# Patient Record
Sex: Female | Born: 1980 | Race: Black or African American | Hispanic: No | Marital: Single | Smoking: Never smoker
Health system: Southern US, Community
[De-identification: ages and names within clinical notes are randomized; demographics above are authoritative.]

## PROBLEM LIST (undated history)

## (undated) DIAGNOSIS — E669 Obesity, unspecified: Secondary | ICD-10-CM

## (undated) DIAGNOSIS — D219 Benign neoplasm of connective and other soft tissue, unspecified: Secondary | ICD-10-CM

## (undated) DIAGNOSIS — E282 Polycystic ovarian syndrome: Secondary | ICD-10-CM

## (undated) DIAGNOSIS — F419 Anxiety disorder, unspecified: Secondary | ICD-10-CM

---

## 1999-01-29 ENCOUNTER — Emergency Department (HOSPITAL_COMMUNITY): Admission: EM | Admit: 1999-01-29 | Discharge: 1999-01-29 | Payer: Self-pay | Admitting: Emergency Medicine

## 1999-01-29 ENCOUNTER — Encounter: Payer: Self-pay | Admitting: Emergency Medicine

## 2000-10-16 ENCOUNTER — Other Ambulatory Visit: Admission: RE | Admit: 2000-10-16 | Discharge: 2000-10-16 | Payer: Self-pay | Admitting: Obstetrics & Gynecology

## 2000-10-16 ENCOUNTER — Encounter (INDEPENDENT_AMBULATORY_CARE_PROVIDER_SITE_OTHER): Payer: Self-pay

## 2002-07-03 ENCOUNTER — Emergency Department (HOSPITAL_COMMUNITY): Admission: EM | Admit: 2002-07-03 | Discharge: 2002-07-04 | Payer: Self-pay | Admitting: Emergency Medicine

## 2002-07-04 ENCOUNTER — Encounter: Payer: Self-pay | Admitting: Emergency Medicine

## 2002-08-11 ENCOUNTER — Other Ambulatory Visit: Admission: RE | Admit: 2002-08-11 | Discharge: 2002-08-11 | Payer: Self-pay | Admitting: Obstetrics & Gynecology

## 2003-12-30 ENCOUNTER — Other Ambulatory Visit: Admission: RE | Admit: 2003-12-30 | Discharge: 2003-12-30 | Payer: Self-pay | Admitting: Obstetrics & Gynecology

## 2004-06-02 ENCOUNTER — Encounter: Admission: RE | Admit: 2004-06-02 | Discharge: 2004-06-02 | Payer: Self-pay | Admitting: Obstetrics & Gynecology

## 2006-06-29 ENCOUNTER — Emergency Department (HOSPITAL_COMMUNITY): Admission: EM | Admit: 2006-06-29 | Discharge: 2006-06-29 | Payer: Self-pay | Admitting: Emergency Medicine

## 2006-11-03 ENCOUNTER — Emergency Department (HOSPITAL_COMMUNITY): Admission: EM | Admit: 2006-11-03 | Discharge: 2006-11-03 | Payer: Self-pay | Admitting: Emergency Medicine

## 2007-12-31 ENCOUNTER — Emergency Department (HOSPITAL_COMMUNITY): Admission: EM | Admit: 2007-12-31 | Discharge: 2007-12-31 | Payer: Self-pay | Admitting: Emergency Medicine

## 2009-04-25 ENCOUNTER — Emergency Department (HOSPITAL_COMMUNITY): Admission: EM | Admit: 2009-04-25 | Discharge: 2009-04-25 | Payer: Self-pay | Admitting: Emergency Medicine

## 2009-06-05 ENCOUNTER — Emergency Department (HOSPITAL_COMMUNITY): Admission: EM | Admit: 2009-06-05 | Discharge: 2009-06-05 | Payer: Self-pay | Admitting: Emergency Medicine

## 2010-06-11 ENCOUNTER — Inpatient Hospital Stay (HOSPITAL_COMMUNITY): Admission: AD | Admit: 2010-06-11 | Discharge: 2010-06-11 | Payer: Self-pay | Admitting: Family Medicine

## 2010-06-11 ENCOUNTER — Ambulatory Visit: Payer: Self-pay | Admitting: Obstetrics and Gynecology

## 2010-07-10 ENCOUNTER — Emergency Department (HOSPITAL_COMMUNITY): Admission: EM | Admit: 2010-07-10 | Discharge: 2010-07-10 | Payer: Self-pay | Admitting: Emergency Medicine

## 2011-01-24 ENCOUNTER — Emergency Department (HOSPITAL_BASED_OUTPATIENT_CLINIC_OR_DEPARTMENT_OTHER)
Admission: EM | Admit: 2011-01-24 | Discharge: 2011-01-24 | Disposition: A | Discharge status: Home / Self Care | Payer: BC Managed Care – PPO | Attending: Emergency Medicine | Admitting: Emergency Medicine

## 2011-01-24 DIAGNOSIS — R05 Cough: Secondary | ICD-10-CM | POA: Insufficient documentation

## 2011-01-24 DIAGNOSIS — R059 Cough, unspecified: Secondary | ICD-10-CM | POA: Insufficient documentation

## 2011-01-24 DIAGNOSIS — J40 Bronchitis, not specified as acute or chronic: Secondary | ICD-10-CM | POA: Insufficient documentation

## 2011-02-25 LAB — URINE MICROSCOPIC-ADD ON

## 2011-02-25 LAB — URINALYSIS, ROUTINE W REFLEX MICROSCOPIC: Urobilinogen, UA: 0.2 mg/dL (ref 0.0–1.0)

## 2011-02-25 LAB — CBC
HCT: 36.8 % (ref 36.0–46.0)
MCH: 28.2 pg (ref 26.0–34.0)
MCV: 84.2 fL (ref 78.0–100.0)
RBC: 4.38 MIL/uL (ref 3.87–5.11)
WBC: 12.1 10*3/uL — ABNORMAL HIGH (ref 4.0–10.5)

## 2011-02-25 LAB — POCT PREGNANCY, URINE: Preg Test, Ur: NEGATIVE

## 2011-02-25 LAB — WET PREP, GENITAL: Clue Cells Wet Prep HPF POC: NONE SEEN

## 2011-03-20 LAB — URINALYSIS, ROUTINE W REFLEX MICROSCOPIC
Bilirubin Urine: NEGATIVE
Glucose, UA: NEGATIVE mg/dL
Hgb urine dipstick: NEGATIVE
Nitrite: NEGATIVE
Urobilinogen, UA: 0.2 mg/dL (ref 0.0–1.0)

## 2011-03-20 LAB — URINE MICROSCOPIC-ADD ON

## 2011-03-20 LAB — WET PREP, GENITAL
Clue Cells Wet Prep HPF POC: NONE SEEN
Trich, Wet Prep: NONE SEEN

## 2011-03-20 LAB — GC/CHLAMYDIA PROBE AMP, GENITAL: Chlamydia, DNA Probe: NEGATIVE

## 2012-07-29 ENCOUNTER — Other Ambulatory Visit: Payer: Self-pay | Admitting: Obstetrics & Gynecology

## 2012-07-29 DIAGNOSIS — D259 Leiomyoma of uterus, unspecified: Secondary | ICD-10-CM

## 2012-07-30 ENCOUNTER — Ambulatory Visit (HOSPITAL_COMMUNITY)
Admission: RE | Admit: 2012-07-30 | Discharge: 2012-07-30 | Disposition: A | Discharge status: Home / Self Care | Payer: BC Managed Care – PPO | Source: Ambulatory Visit | Attending: Obstetrics & Gynecology | Admitting: Obstetrics & Gynecology

## 2012-07-30 DIAGNOSIS — D259 Leiomyoma of uterus, unspecified: Secondary | ICD-10-CM

## 2012-08-04 ENCOUNTER — Other Ambulatory Visit: Payer: Self-pay | Admitting: Obstetrics & Gynecology

## 2012-08-05 ENCOUNTER — Ambulatory Visit
Admission: RE | Admit: 2012-08-05 | Discharge: 2012-08-05 | Disposition: A | Discharge status: Home / Self Care | Payer: BC Managed Care – PPO | Source: Ambulatory Visit | Attending: Obstetrics & Gynecology | Admitting: Obstetrics & Gynecology

## 2012-08-05 ENCOUNTER — Other Ambulatory Visit: Payer: Self-pay | Admitting: Obstetrics & Gynecology

## 2012-08-05 ENCOUNTER — Ambulatory Visit: Admission: RE | Admit: 2012-08-05 | Payer: BC Managed Care – PPO | Source: Ambulatory Visit

## 2012-08-05 DIAGNOSIS — D259 Leiomyoma of uterus, unspecified: Secondary | ICD-10-CM

## 2012-08-05 MED ORDER — GADOBENATE DIMEGLUMINE 529 MG/ML IV SOLN
20.0000 mL | Freq: Once | INTRAVENOUS | Status: AC | PRN
  Administered 2012-08-05: 20 mL via INTRAVENOUS

## 2012-09-01 ENCOUNTER — Encounter (HOSPITAL_COMMUNITY): Payer: Self-pay | Admitting: Pharmacy Technician

## 2012-09-08 NOTE — Patient Instructions (Signed)
20 Desa L Florance  09/08/2012   Your procedure is scheduled on:  09/12/12 [id number]  Report to [facility] at 0515 AM.  Call this number if you have problems the morning of surgery: [phone]   Remember:   Do not eat food:After Midnight.  May have clear liquids:until Midnight .    Take these medicines the morning of surgery with A SIP OF WATER:    Do not wear jewelry, make-up or nail polish.  Do not wear lotions, powders, or perfumes.   Do not shave 48 hours prior to surgery.   Do not bring valuables to the hospital.  Contacts, dentures or bridgework may not be worn into surgery.  Leave suitcase in the car. After surgery it may be brought to your room.  For patients admitted to the hospital, checkout time is 11:00 AM the day of discharge.   Special Instructions: SEE CHG INSTRUCTION SHEET    Please read over the following fact sheets that you were given: MRSA Information, coughing and deep breathing exercises, leg exercises

## 2012-09-09 ENCOUNTER — Encounter (HOSPITAL_COMMUNITY)
Admission: RE | Admit: 2012-09-09 | Discharge: 2012-09-09 | Disposition: A | Discharge status: Home / Self Care | Payer: BC Managed Care – PPO | Source: Ambulatory Visit | Attending: Obstetrics & Gynecology | Admitting: Obstetrics & Gynecology

## 2012-09-09 ENCOUNTER — Encounter (HOSPITAL_COMMUNITY): Payer: Self-pay

## 2012-09-09 ENCOUNTER — Ambulatory Visit (HOSPITAL_COMMUNITY)
Admission: RE | Admit: 2012-09-09 | Discharge: 2012-09-09 | Disposition: A | Discharge status: Home / Self Care | Payer: BC Managed Care – PPO | Source: Ambulatory Visit | Attending: Obstetrics & Gynecology | Admitting: Obstetrics & Gynecology

## 2012-09-09 DIAGNOSIS — Z01812 Encounter for preprocedural laboratory examination: Secondary | ICD-10-CM | POA: Insufficient documentation

## 2012-09-09 DIAGNOSIS — Z01818 Encounter for other preprocedural examination: Secondary | ICD-10-CM | POA: Insufficient documentation

## 2012-09-09 DIAGNOSIS — Z01810 Encounter for preprocedural cardiovascular examination: Secondary | ICD-10-CM | POA: Insufficient documentation

## 2012-09-09 DIAGNOSIS — R0602 Shortness of breath: Secondary | ICD-10-CM | POA: Insufficient documentation

## 2012-09-09 LAB — SURGICAL PCR SCREEN
MRSA, PCR: NEGATIVE
Staphylococcus aureus: NEGATIVE

## 2012-09-09 LAB — CBC
HCT: 38.8 % (ref 36.0–46.0)
MCV: 80.7 fL (ref 78.0–100.0)
Platelets: 315 10*3/uL (ref 150–400)
RBC: 4.81 MIL/uL (ref 3.87–5.11)
WBC: 6.6 10*3/uL (ref 4.0–10.5)

## 2012-09-09 LAB — BASIC METABOLIC PANEL
CO2: 21 mEq/L (ref 19–32)
Chloride: 104 mEq/L (ref 96–112)
Creatinine, Ser: 0.68 mg/dL (ref 0.50–1.10)

## 2012-09-09 LAB — HCG, SERUM, QUALITATIVE: Preg, Serum: NEGATIVE

## 2012-09-10 ENCOUNTER — Encounter (HOSPITAL_COMMUNITY): Payer: Self-pay | Admitting: Obstetrics & Gynecology

## 2012-09-10 DIAGNOSIS — D259 Leiomyoma of uterus, unspecified: Secondary | ICD-10-CM | POA: Diagnosis present

## 2012-09-10 NOTE — H&P (Signed)
  Subjective:  Courtney Molina is a 31 y.o.  female.  She has a history of AUB. Bleeding is characterized as heavy. Length of bleeding: prolonged.  Evaluation to date: lab: CBC with diff, TSH and prolactin, testosterone, pelvic ultrasound: positive for a dominant uterine myoma. and a MRI was performed for mapping purposes.The MRI demonstrated an intramural, type 2, posterior myoma without endometrial involvement. Treatment to date: provera  Pertinent Gyn History: PCOS Menses see above Bleeding: see above  Last pap: normal Date: 4/12  Patient Active Problem List   Diagnosis Date Noted  . Leiomyoma of uterus, unspecified 09/10/2012   History reviewed. No pertinent past medical history.  History reviewed. No pertinent past surgical history.  No prescriptions prior to admission   Allergies  Allergen Reactions  . Pineapple Other (See Comments)    Allergy from when she was younger, cause her to break out, not sure if it still an allergy today    History  Substance Use Topics  . Smoking status: Never Smoker   . Smokeless tobacco: Never Used  . Alcohol Use: Yes     socially     History reviewed. No pertinent family history.   Review of Systems Pertinent items are noted in HPI.    Objective:   Vital signs in last 24 hours:    General:   alert  Skin:   normal  HEENT:  PERRLA  Lungs:   clear to auscultation bilaterally  Heart:   regular rate and rhythm, S1, S2 normal, no murmur, click, rub or gallop  Breasts:   normal without suspicious masses, skin or nipple changes or axillary nodes  Abdomen:  soft, non-tender; bowel sounds normal; no masses,  no organomegaly  Pelvis:  External genitalia: normal general appearance Vaginal: normal mucosa without prolapse or lesions Cervix: normal appearance Adnexa: normal bimanual exam Uterus: anteverted and enlarged                                     Assessment/Plan:  Symptomatic fibroid uterus.  AUB-L, O    I had a  lengthy discussion with the patient regarding her bleeding and consideration for a robotic-assisted myomectomy. Procedure, risks, reasons, benefits and complications (including injury to bowel, bladder, major blood vessel, ureter, bleeding, possibility of transfusion, infection, or fistula formation), possible cesarean delivery with a subsequent pregnancy were reviewed in detail. Consent was signed and preop testing was ordered.  Instructions were reviewed, including NPO after midnight.

## 2012-09-12 ENCOUNTER — Ambulatory Visit (HOSPITAL_COMMUNITY): Payer: BC Managed Care – PPO | Admitting: Anesthesiology

## 2012-09-12 ENCOUNTER — Encounter (HOSPITAL_COMMUNITY): Payer: Self-pay | Admitting: *Deleted

## 2012-09-12 ENCOUNTER — Encounter (HOSPITAL_COMMUNITY)
Admission: RE | Disposition: A | Discharge status: Home / Self Care | Payer: Self-pay | Source: Ambulatory Visit | Attending: Obstetrics & Gynecology

## 2012-09-12 ENCOUNTER — Ambulatory Visit (HOSPITAL_COMMUNITY)
Admission: RE | Admit: 2012-09-12 | Discharge: 2012-09-12 | Disposition: A | Discharge status: Home / Self Care | Payer: BC Managed Care – PPO | Source: Ambulatory Visit | Attending: Obstetrics & Gynecology | Admitting: Obstetrics & Gynecology

## 2012-09-12 ENCOUNTER — Encounter (HOSPITAL_COMMUNITY): Payer: Self-pay | Admitting: Anesthesiology

## 2012-09-12 DIAGNOSIS — D259 Leiomyoma of uterus, unspecified: Secondary | ICD-10-CM

## 2012-09-12 DIAGNOSIS — N926 Irregular menstruation, unspecified: Secondary | ICD-10-CM | POA: Insufficient documentation

## 2012-09-12 DIAGNOSIS — N939 Abnormal uterine and vaginal bleeding, unspecified: Secondary | ICD-10-CM | POA: Insufficient documentation

## 2012-09-12 HISTORY — PX: ROBOT ASSISTED MYOMECTOMY: SHX5142

## 2012-09-12 HISTORY — DX: Benign neoplasm of connective and other soft tissue, unspecified: D21.9

## 2012-09-12 HISTORY — DX: Obesity, unspecified: E66.9

## 2012-09-12 LAB — GLUCOSE, CAPILLARY: Glucose-Capillary: 140 mg/dL — ABNORMAL HIGH (ref 70–99)

## 2012-09-12 LAB — TYPE AND SCREEN
ABO/RH(D): B NEG
Antibody Screen: NEGATIVE

## 2012-09-12 SURGERY — ROBOTIC ASSISTED MYOMECTOMY
Anesthesia: General | Wound class: Clean Contaminated

## 2012-09-12 MED ORDER — VASOPRESSIN 20 UNIT/ML IJ SOLN
INTRAMUSCULAR | Status: DC | PRN
  Administered 2012-09-12: 20 [IU]

## 2012-09-12 MED ORDER — ACETAMINOPHEN 10 MG/ML IV SOLN
INTRAVENOUS | Status: DC | PRN
  Administered 2012-09-12: 1000 mg via INTRAVENOUS

## 2012-09-12 MED ORDER — ONDANSETRON HCL 4 MG/2ML IJ SOLN
4.0000 mg | Freq: Four times a day (QID) | INTRAMUSCULAR | Status: DC | PRN
  Administered 2012-09-12: 4 mg via INTRAVENOUS
  Filled 2012-09-12: qty 2

## 2012-09-12 MED ORDER — METOCLOPRAMIDE HCL 5 MG/ML IJ SOLN
INTRAMUSCULAR | Status: DC | PRN
  Administered 2012-09-12: 10 mg via INTRAVENOUS

## 2012-09-12 MED ORDER — VASOPRESSIN 20 UNIT/ML IJ SOLN
Freq: Once | INTRAVENOUS | Status: DC
  Filled 2012-09-12: qty 1

## 2012-09-12 MED ORDER — HETASTARCH-ELECTROLYTES 6 % IV SOLN: INTRAVENOUS | Status: DC | PRN

## 2012-09-12 MED ORDER — PROMETHAZINE HCL 25 MG/ML IJ SOLN: 6.2500 mg | INTRAMUSCULAR | Status: DC | PRN

## 2012-09-12 MED ORDER — LABETALOL HCL 5 MG/ML IV SOLN
INTRAVENOUS | Status: AC
  Filled 2012-09-12: qty 4

## 2012-09-12 MED ORDER — SODIUM CHLORIDE 0.9 % IJ SOLN: 3.0000 mL | INTRAMUSCULAR | Status: DC | PRN

## 2012-09-12 MED ORDER — SODIUM CHLORIDE 0.9 % IJ SOLN: 3.0000 mL | Freq: Two times a day (BID) | INTRAMUSCULAR | Status: DC

## 2012-09-12 MED ORDER — ACETAMINOPHEN 650 MG RE SUPP
650.0000 mg | RECTAL | Status: DC | PRN
  Filled 2012-09-12: qty 1

## 2012-09-12 MED ORDER — HETASTARCH-ELECTROLYTES 6 % IV SOLN
INTRAVENOUS | Status: DC | PRN
  Administered 2012-09-12: 08:00:00 via INTRAVENOUS

## 2012-09-12 MED ORDER — BUPIVACAINE HCL (PF) 0.5 % IJ SOLN
INTRAMUSCULAR | Status: DC | PRN
  Administered 2012-09-12: 10 mL

## 2012-09-12 MED ORDER — ACETAMINOPHEN 10 MG/ML IV SOLN
INTRAVENOUS | Status: AC
  Filled 2012-09-12: qty 100

## 2012-09-12 MED ORDER — DEXTROSE 5 % IV SOLN
3.0000 g | INTRAVENOUS | Status: AC
  Administered 2012-09-12: 3 g via INTRAVENOUS
  Filled 2012-09-12: qty 3000

## 2012-09-12 MED ORDER — ROCURONIUM BROMIDE 100 MG/10ML IV SOLN
INTRAVENOUS | Status: DC | PRN
  Administered 2012-09-12: 60 mg via INTRAVENOUS
  Administered 2012-09-12: 30 mg via INTRAVENOUS
  Administered 2012-09-12 (×2): 10 mg via INTRAVENOUS

## 2012-09-12 MED ORDER — LABETALOL HCL 5 MG/ML IV SOLN
INTRAVENOUS | Status: DC | PRN
  Administered 2012-09-12 (×2): 10 mg via INTRAVENOUS

## 2012-09-12 MED ORDER — FENTANYL CITRATE 0.05 MG/ML IJ SOLN
25.0000 ug | INTRAMUSCULAR | Status: DC | PRN
  Administered 2012-09-12 (×3): 25 ug via INTRAVENOUS

## 2012-09-12 MED ORDER — FENTANYL CITRATE 0.05 MG/ML IJ SOLN
INTRAMUSCULAR | Status: AC
  Filled 2012-09-12: qty 2

## 2012-09-12 MED ORDER — CEFAZOLIN SODIUM 1-5 GM-% IV SOLN
INTRAVENOUS | Status: AC
  Filled 2012-09-12: qty 50

## 2012-09-12 MED ORDER — CEFAZOLIN SODIUM-DEXTROSE 2-3 GM-% IV SOLR
INTRAVENOUS | Status: AC
  Filled 2012-09-12: qty 50

## 2012-09-12 MED ORDER — MIDAZOLAM HCL 5 MG/5ML IJ SOLN
INTRAMUSCULAR | Status: DC | PRN
  Administered 2012-09-12: 2 mg via INTRAVENOUS
  Administered 2012-09-12: .5 mg via INTRAVENOUS

## 2012-09-12 MED ORDER — SUFENTANIL CITRATE 50 MCG/ML IV SOLN
INTRAVENOUS | Status: DC | PRN
  Administered 2012-09-12 (×2): 10 ug via INTRAVENOUS
  Administered 2012-09-12 (×3): 5 ug via INTRAVENOUS
  Administered 2012-09-12: 10 ug via INTRAVENOUS
  Administered 2012-09-12: 5 ug via INTRAVENOUS
  Administered 2012-09-12: 15 ug via INTRAVENOUS
  Administered 2012-09-12 (×2): 5 ug via INTRAVENOUS
  Administered 2012-09-12: 10 ug via INTRAVENOUS
  Administered 2012-09-12 (×3): 5 ug via INTRAVENOUS
  Administered 2012-09-12: 15 ug via INTRAVENOUS

## 2012-09-12 MED ORDER — HYDROMORPHONE HCL PF 1 MG/ML IJ SOLN: 0.2500 mg | INTRAMUSCULAR | Status: DC | PRN

## 2012-09-12 MED ORDER — OXYCODONE-ACETAMINOPHEN 5-325 MG PO TABS: 1.0000 | ORAL_TABLET | ORAL | Status: DC | PRN

## 2012-09-12 MED ORDER — ACETAMINOPHEN 325 MG PO TABS: 650.0000 mg | ORAL_TABLET | ORAL | Status: DC | PRN

## 2012-09-12 MED ORDER — ONDANSETRON HCL 4 MG/2ML IJ SOLN
INTRAMUSCULAR | Status: DC | PRN
  Administered 2012-09-12: 4 mg via INTRAVENOUS

## 2012-09-12 MED ORDER — LACTATED RINGERS IV SOLN
INTRAVENOUS | Status: DC | PRN
  Administered 2012-09-12 (×2): via INTRAVENOUS

## 2012-09-12 MED ORDER — BUPIVACAINE HCL (PF) 0.5 % IJ SOLN
INTRAMUSCULAR | Status: AC
  Filled 2012-09-12: qty 30

## 2012-09-12 MED ORDER — SODIUM CHLORIDE 0.9 % IV SOLN: 250.0000 mL | INTRAVENOUS | Status: DC | PRN

## 2012-09-12 MED ORDER — PROPOFOL 10 MG/ML IV BOLUS
INTRAVENOUS | Status: DC | PRN
  Administered 2012-09-12: 80 mg via INTRAVENOUS
  Administered 2012-09-12: 60 mg via INTRAVENOUS
  Administered 2012-09-12: 300 mg via INTRAVENOUS

## 2012-09-12 MED ORDER — LIDOCAINE HCL 4 % MT SOLN
OROMUCOSAL | Status: DC | PRN
  Administered 2012-09-12: 4 mL via TOPICAL

## 2012-09-12 MED ORDER — NEOSTIGMINE METHYLSULFATE 1 MG/ML IJ SOLN
INTRAMUSCULAR | Status: DC | PRN
  Administered 2012-09-12: 5 mg via INTRAVENOUS

## 2012-09-12 MED ORDER — HYDROMORPHONE HCL PF 1 MG/ML IJ SOLN
INTRAMUSCULAR | Status: DC | PRN
  Administered 2012-09-12 (×3): 1 mg via INTRAVENOUS

## 2012-09-12 MED ORDER — GLYCOPYRROLATE 0.2 MG/ML IJ SOLN
INTRAMUSCULAR | Status: DC | PRN
  Administered 2012-09-12: .8 mg via INTRAVENOUS

## 2012-09-12 MED ORDER — IBUPROFEN 800 MG PO TABS: 800.0000 mg | ORAL_TABLET | Freq: Three times a day (TID) | ORAL | Status: DC | PRN

## 2012-09-12 MED ORDER — OXYCODONE HCL 5 MG PO TABS
5.0000 mg | ORAL_TABLET | ORAL | Status: DC | PRN
  Administered 2012-09-12: 5 mg via ORAL
  Filled 2012-09-12: qty 1

## 2012-09-12 MED ORDER — SUCCINYLCHOLINE CHLORIDE 20 MG/ML IJ SOLN
INTRAMUSCULAR | Status: DC | PRN
  Administered 2012-09-12: 40 mg via INTRAVENOUS
  Administered 2012-09-12: 140 mg via INTRAVENOUS

## 2012-09-12 MED ORDER — LIDOCAINE HCL (CARDIAC) 20 MG/ML IV SOLN
INTRAVENOUS | Status: DC | PRN
  Administered 2012-09-12: 30 mg via INTRAVENOUS

## 2012-09-12 SURGICAL SUPPLY — 64 items
APPLICATOR SURGIFLO ENDO (HEMOSTASIS) ×2 IMPLANT
BARRIER ADHS 3X4 INTERCEED (GAUZE/BANDAGES/DRESSINGS) ×4 IMPLANT
BLADE LAPAROSCOPIC MORCELL KIT (BLADE) ×4 IMPLANT
CLOTH BEACON ORANGE TIMEOUT ST (SAFETY) ×2 IMPLANT
CORDS BIPOLAR (ELECTRODE) ×2 IMPLANT
COVER MAYO STAND STRL (DRAPES) ×2 IMPLANT
COVER SURGICAL LIGHT HANDLE (MISCELLANEOUS) ×2 IMPLANT
COVER TIP SHEARS 8 DVNC (MISCELLANEOUS) ×1 IMPLANT
COVER TIP SHEARS 8MM DA VINCI (MISCELLANEOUS) ×1
DECANTER SPIKE VIAL GLASS SM (MISCELLANEOUS) ×2 IMPLANT
DERMABOND ADVANCED (GAUZE/BANDAGES/DRESSINGS) ×2
DERMABOND ADVANCED .7 DNX12 (GAUZE/BANDAGES/DRESSINGS) ×2 IMPLANT
DRAPE LG THREE QUARTER DISP (DRAPES) ×4 IMPLANT
DRAPE SURG IRRIG POUCH 19X23 (DRAPES) ×2 IMPLANT
DRAPE TABLE BACK 44X90 PK DISP (DRAPES) ×4 IMPLANT
DRAPE UTILITY XL STRL (DRAPES) ×2 IMPLANT
DRAPE WARM FLUID 44X44 (DRAPE) ×2 IMPLANT
DRSG TEGADERM 2-3/8X2-3/4 SM (GAUZE/BANDAGES/DRESSINGS) ×2 IMPLANT
DRSG TEGADERM 6X8 (GAUZE/BANDAGES/DRESSINGS) ×2 IMPLANT
ELECT REM PT RETURN 9FT ADLT (ELECTROSURGICAL) ×2
ELECTRODE REM PT RTRN 9FT ADLT (ELECTROSURGICAL) ×1 IMPLANT
FILTER SMOKE EVAC LAPAROSHD (FILTER) ×2 IMPLANT
GLOVE BIO SURGEON STRL SZ 6.5 (GLOVE) ×2 IMPLANT
GLOVE SS BIOGEL STRL SZ 8 (GLOVE) ×2 IMPLANT
GLOVE SUPERSENSE BIOGEL SZ 8 (GLOVE) ×2
GOWN STRL NON-REIN LRG LVL3 (GOWN DISPOSABLE) ×8 IMPLANT
IV LACTATED RINGER IRRG 3000ML (IV SOLUTION) ×1
IV LR IRRIG 3000ML ARTHROMATIC (IV SOLUTION) ×1 IMPLANT
KIT ACCESSORY DA VINCI DISP (KITS) ×1
KIT ACCESSORY DVNC DISP (KITS) ×1 IMPLANT
LUBRICANT JELLY K Y 4OZ (MISCELLANEOUS) ×2 IMPLANT
MANIPULATOR UTERINE 4.5 ZUMI (MISCELLANEOUS) ×2 IMPLANT
NEEDLE SPNL 22GX9.0 ACCUTG (NEEDLE) ×2 IMPLANT
OCCLUDER COLPOPNEUMO (BALLOONS) IMPLANT
PACK LAPAROSCOPY W LONG (CUSTOM PROCEDURE TRAY) ×2 IMPLANT
PENCIL BUTTON HOLSTER BLD 10FT (ELECTRODE) ×2 IMPLANT
POSITIONER SURGICAL ARM (MISCELLANEOUS) ×4 IMPLANT
POUCH SPECIMEN RETRIEVAL 10MM (ENDOMECHANICALS) ×2 IMPLANT
SEPRAFILM MEMBRANE 5X6 (MISCELLANEOUS) ×2 IMPLANT
SET TUBE IRRIG SUCTION NO TIP (IRRIGATION / IRRIGATOR) ×2 IMPLANT
SHEET LAVH (DRAPES) ×2 IMPLANT
SOLUTION ELECTROLUBE (MISCELLANEOUS) ×2 IMPLANT
SUT MNCRL AB 4-0 PS2 18 (SUTURE) ×4 IMPLANT
SUT V-LOC BARB 180 2/0GR6 GS22 (SUTURE) ×2
SUT VIC AB 0 CT1 27 (SUTURE)
SUT VIC AB 0 CT1 27XBRD ANTBC (SUTURE) IMPLANT
SUT VIC AB 0 CT2 27 (SUTURE) ×8 IMPLANT
SUT VIC AB 2-0 CT2 27 (SUTURE) IMPLANT
SUT VICRYL 0 UR6 27IN ABS (SUTURE) ×4 IMPLANT
SUT VLOC BARB 180 ABS3/0GR12 (SUTURE) ×4
SUTURE V-LC BRB 180 2/0GR6GS22 (SUTURE) ×1 IMPLANT
SUTURE VLOC BRB 180 ABS3/0GR12 (SUTURE) ×2 IMPLANT
SYR 20CC LL (SYRINGE) ×2 IMPLANT
SYR 50ML LL SCALE MARK (SYRINGE) ×2 IMPLANT
SYR BULB IRRIGATION 50ML (SYRINGE) ×2 IMPLANT
TOWEL OR 17X26 10 PK STRL BLUE (TOWEL DISPOSABLE) ×2 IMPLANT
TRAY FOLEY CATH 14FRSI W/METER (CATHETERS) ×2 IMPLANT
TROCAR 12M 150ML BLUNT (TROCAR) ×2 IMPLANT
TROCAR BLADELESS OPT 5 150 (ENDOMECHANICALS) ×2 IMPLANT
TROCAR BLADELESS OPT 5 75 (ENDOMECHANICALS) ×2 IMPLANT
TROCAR ENDOPATH XCEL 12X100 BL (ENDOMECHANICALS) ×2 IMPLANT
TROCAR XCEL 12X100 BLDLESS (ENDOMECHANICALS) ×2 IMPLANT
TUBING FILTER THERMOFLATOR (ELECTROSURGICAL) ×2 IMPLANT
WATER STERILE IRR 1500ML POUR (IV SOLUTION) ×4 IMPLANT

## 2012-09-12 NOTE — Op Note (Signed)
Pre-operative Diagnosis: abnormal uterine bleeding and fibroids  Post-operative Diagnosis: same  Operation: Robotic-assisted myomectomy  Surgeon: Roseanna Rainbow  Assistant: Coral Ceo, MD  Anesthesia: GET  Urine Output: per Anesthesiology  Findings: Dominant, posterior, intramural myoma, approximately 6 cm in diameter  Estimated Blood Loss:  less than 100 mL                 Total IV Fluids: per Anesthesiology         Specimens: PATHOLOGY               Complications:  None; patient tolerated the procedure well.         Disposition: PACU - hemodynamically stable.         Condition: Stable    Procedure Details  The patient was seen in the Holding Room. The risks, benefits, complications, treatment options, and expected outcomes were discussed with the patient.  The patient concurred with the proposed plan, giving informed consent.  The site of surgery properly noted/marked. The patient was identified as Courtney Molina and the procedure verified as a Robotic-assisted myomectomy.  A Time Out was held and the above information confirmed.  After induction of anesthesia, the patient was draped and prepped in the usual sterile manner. Pt was placed in supine position after anesthesia and draped and prepped in the usual sterile manner. The abdominal drape was placed after the CholoraPrep had been allowed to dry for 3 minutes.  Her arms were tucked to her side with all appropriate precautions.  The shoulder blocks were placed in the usual fashion.  The patient was placed in the semi-lithotomy position in [place] stirrups.  The perineum was prepped with Betadine.  Foley catheter was placed.  A sterile speculum was placed in the vagina.  The cervix was grasped with a single-tooth tenaculum and dilated with Shawnie Pons dilators.  The ZUMI uterine manipulator  was placed without difficulty.  .  A second time-out was performed.  OG tube placement was confirmed and to suction.   Approximately 2 cm below the costal margin, in the midclavicular line the skin was anesthestized with 0.25% Marcaine.  A 5 mm incision was made and using a 5 mm Optiview, a 5 mm trocar was placed under direct vision.  The patient's abdomen was insufflated with CO2 gas.  At this point and all points during the procedure, the patient's intra-abdominal pressure did not exceed 15 mmHg.  A 10-12 camera port was place 24 cm above the pubic symphysis.  Bilateral 8 mm ports were place 10 cm and 15 degrees inferior.  An 8 mm port placed in the right upper quadrant.   All ports were placed under direct visualization.  The 5 mm port was removed.  The incision was extended to accommodate a 10 mm trocar.  A 10 mm trocar and sleeve were advanced under direct visualization.   The robot was docked in the usual fashion.  The myometrium overlying the myoma was infiltrated with a dilute Pitressin solution.  Using monopolar scissors, the serosa and myometrium overlying the myoma was incised in a transverse fashion.  The incision was extended to capsule.  The myoma was grasped with a tenaculum.  Using sharp and blunt dissection, the myoma was enucleated.  The defect in the myometrium was closed in 3 layers.  This was accomplished using a running 2-0 Vlock suture for the initial two layers.  The serosa was closed with a running 3-0 Vlock suture.  Seprafilm was drizzled over  the incision.   The robot was undocked.  The left upper quadrant port was replaced with a morcellator.  The myoma was morcellated.  Visualized fragments were removed from the abdomen.  The ports were removed.  Deep, subcutaneous, figure-of-eight 0-Vicryl sutures on a UR-6 needle were placed in the 10-12 mm supraumbilical and infracostal incisions.  All skin incisions were closed in a subcuticular fashion using 3-0 Monocryl.  Dermabond was then applied.  The ZUMI manipulator was removed.  All instrument and needle counts were correct x  2.

## 2012-09-12 NOTE — Progress Notes (Signed)
Pt arrived in PACU II with 18 gauge angiocath left hand with LR infusing via gravity. Removed with catheter intact and gauze dressing applied with tape. Removed per protocol for pt discharge. Pt able to ambulate to bathroom and tolerated well.

## 2012-09-12 NOTE — Anesthesia Procedure Notes (Signed)
Procedure Name: Intubation Performed by: Anastasio Champion E Pre-anesthesia Checklist: Patient identified, Emergency Drugs available, Suction available and Patient being monitored Patient Re-evaluated:Patient Re-evaluated prior to inductionOxygen Delivery Method: Circle system utilized Preoxygenation: Pre-oxygenation with 100% oxygen Intubation Type: IV induction Ventilation: Mask ventilation without difficulty Tube type: Oral Tube size: 7.5 mm Number of attempts: 1 Airway Equipment and Method: Rigid stylet Placement Confirmation: ETT inserted through vocal cords under direct vision,  positive ETCO2 and breath sounds checked- equal and bilateral Secured at: 21 cm Tube secured with: Tape Dental Injury: Teeth and Oropharynx as per pre-operative assessment  Difficulty Due To: Difficulty was anticipated, Difficult Airway- due to reduced neck mobility, Difficult Airway- due to large tongue and Difficult Airway- due to limited oral opening Future Recommendations: Recommend- induction with short-acting agent, and alternative techniques readily available Comments: Pt preoxygenated for 3 minutes. Very short neck with limited opening, large tongue,larynx slightly anterior.  MAC 4 used with rigid stylet for glidescope used tand glide ETT used to facilitate angle of larynx.  +ETCO2, BBS by Dr. Payton Doughty.No pillow under head for intubation but placed gel donuts after

## 2012-09-12 NOTE — Anesthesia Preprocedure Evaluation (Addendum)
Anesthesia Evaluation  Patient identified by MRN, date of birth, ID band Patient awake    Reviewed: Allergy & Precautions, H&P , NPO status , Patient's Chart, lab work & pertinent test results  Airway Mallampati: III TM Distance: >3 FB Neck ROM: Full    Dental No notable dental hx.    Pulmonary neg pulmonary ROS,  breath sounds clear to auscultation  Pulmonary exam normal       Cardiovascular negative cardio ROS  Rhythm:Regular Rate:Normal     Neuro/Psych negative neurological ROS  negative psych ROS   GI/Hepatic negative GI ROS, Neg liver ROS,   Endo/Other  diabetes, Type 2, Oral Hypoglycemic AgentsMorbid obesity  Renal/GU negative Renal ROS  negative genitourinary   Musculoskeletal negative musculoskeletal ROS (+)   Abdominal (+) + obese,   Peds negative pediatric ROS (+)  Hematology negative hematology ROS (+)   Anesthesia Other Findings   Reproductive/Obstetrics negative OB ROS                          Anesthesia Physical Anesthesia Plan  ASA: III  Anesthesia Plan: General   Post-op Pain Management:    Induction: Intravenous  Airway Management Planned: Oral ETT  Additional Equipment:   Intra-op Plan:   Post-operative Plan: Extubation in OR  Informed Consent: I have reviewed the patients History and Physical, chart, labs and discussed the procedure including the risks, benefits and alternatives for the proposed anesthesia with the patient or authorized representative who has indicated his/her understanding and acceptance.   Dental advisory given  Plan Discussed with: CRNA  Anesthesia Plan Comments: (Discussed with patient the possibility that because of her weight, robotic surgery may not by possible (ventilation issues). Patient voices understanding.)       Anesthesia Quick Evaluation

## 2012-09-12 NOTE — Transfer of Care (Signed)
Immediate Anesthesia Transfer of Care Note  Patient: Courtney Molina  Procedure(s) Performed: Procedure(s) (LRB) with comments: ROBOTIC ASSISTED MYOMECTOMY (N/A)  Patient Location: PACU  Anesthesia Type: General  Level of Consciousness: awake, alert , oriented and patient cooperative  Airway & Oxygen Therapy: Patient Spontanous Breathing and Patient connected to face mask oxygen  Post-op Assessment: Report given to PACU RN, Post -op Vital signs reviewed and stable and Patient moving all extremities X 4  Post vital signs: stable  Complications: No apparent anesthesia complications

## 2012-09-12 NOTE — Anesthesia Postprocedure Evaluation (Signed)
  Anesthesia Post-op Note  Patient: Courtney Molina  Procedure(s) Performed: Procedure(s) (LRB): ROBOTIC ASSISTED MYOMECTOMY (N/A)  Patient Location: PACU  Anesthesia Type: General  Level of Consciousness: awake and alert   Airway and Oxygen Therapy: Patient Spontanous Breathing  Post-op Pain: mild  Post-op Assessment: Post-op Vital signs reviewed, Patient's Cardiovascular Status Stable, Respiratory Function Stable, Patent Airway and No signs of Nausea or vomiting  Post-op Vital Signs: stable  Complications: No apparent anesthesia complications. Will keep her longer in Short Stay Unit.

## 2012-09-12 NOTE — Interval H&P Note (Signed)
History and Physical Interval Note:  09/12/2012 7:02 AM  Courtney Molina  has presented today for surgery, with the diagnosis of uterine fibroids  The various methods of treatment have been discussed with the patient and family. After consideration of risks, benefits and other options for treatment, the patient has consented to  Procedure(s) (LRB) with comments: ROBOTIC ASSISTED MYOMECTOMY (N/A) as a surgical intervention .  The patient's history has been reviewed, patient examined, no change in status, stable for surgery.  I have reviewed the patient's chart and labs.  Questions were answered to the patient's satisfaction.     JACKSON-MOORE,Tyreona Panjwani A

## 2012-09-15 ENCOUNTER — Encounter (HOSPITAL_COMMUNITY): Payer: Self-pay | Admitting: Obstetrics & Gynecology

## 2012-09-16 ENCOUNTER — Emergency Department (HOSPITAL_COMMUNITY)
Admission: EM | Admit: 2012-09-16 | Discharge: 2012-09-16 | Disposition: A | Discharge status: Home / Self Care | Payer: BC Managed Care – PPO | Attending: Emergency Medicine | Admitting: Emergency Medicine

## 2012-09-16 ENCOUNTER — Encounter (HOSPITAL_COMMUNITY): Payer: Self-pay | Admitting: Emergency Medicine

## 2012-09-16 DIAGNOSIS — T819XXA Unspecified complication of procedure, initial encounter: Secondary | ICD-10-CM | POA: Insufficient documentation

## 2012-09-16 DIAGNOSIS — E669 Obesity, unspecified: Secondary | ICD-10-CM | POA: Insufficient documentation

## 2012-09-16 DIAGNOSIS — Z91018 Allergy to other foods: Secondary | ICD-10-CM | POA: Insufficient documentation

## 2012-09-16 DIAGNOSIS — Y849 Medical procedure, unspecified as the cause of abnormal reaction of the patient, or of later complication, without mention of misadventure at the time of the procedure: Secondary | ICD-10-CM | POA: Insufficient documentation

## 2012-09-16 DIAGNOSIS — E119 Type 2 diabetes mellitus without complications: Secondary | ICD-10-CM | POA: Insufficient documentation

## 2012-09-16 NOTE — ED Provider Notes (Signed)
History     CSN: [id number]  Arrival date & time 09/16/12  1452   First MD Initiated Contact with Patient 09/16/12 1652      Chief Complaint  Patient presents with  . Wound Check    (Consider location/radiation/quality/duration/timing/severity/associated sxs/prior treatment) HPI Comments: Patient presents 4 days after myomectomy. Patient had 5 abdominal surgical wounds closed with tissue adhesive. Today patient has noted that the center incision was slightly opened. She has not noticed significant drainage. She has not had worsening pain, drainage, surrounding redness or erythema. No fever. Nothing makes symptoms better or worse. Onset acute. Course constant. Patient states she called her gynecologist however office has not returned her call.  Patient is a 31 y.o. female presenting with wound check. The history is provided by the patient.  Wound Check     Past Medical History  Diagnosis Date  . Diabetes mellitus   . Fibroids   . Obesity     Past Surgical History  Procedure Date  . Robot assisted myomectomy 09/12/2012    Procedure: ROBOTIC ASSISTED MYOMECTOMY;  Surgeon: Antionette Char, MD;  Location: [facility];  Service: Gynecology;  Laterality: N/A;    History reviewed. No pertinent family history.  History  Substance Use Topics  . Smoking status: Never Smoker   . Smokeless tobacco: Never Used  . Alcohol Use: Yes     socially     OB History    Grav Para Term Preterm Abortions TAB SAB Ect Mult Living                  Review of Systems  Constitutional: Negative for fever.  Gastrointestinal: Negative for nausea and vomiting.  Skin: Positive for wound.    Allergies  Pineapple  Home Medications   Current Outpatient Rx  Name Route Sig Dispense Refill  . METFORMIN HCL 850 MG PO TABS Oral Take 850 mg by mouth daily at 12 noon.    . OXYCODONE-ACETAMINOPHEN 5-325 MG PO TABS Oral Take 1 tablet by mouth every 4 (four) hours as needed for pain. 30 tablet 0  .  IBUPROFEN 800 MG PO TABS Oral Take 1 tablet (800 mg total) by mouth every 8 (eight) hours as needed for pain. 30 tablet 5    BP 114/77  Pulse 88  Temp 98.7 F (37.1 C) (Oral)  SpO2 98%  LMP 08/26/2012  Physical Exam  Nursing note and vitals reviewed. Constitutional: She appears well-developed and well-nourished.  HENT:  Head: Normocephalic and atraumatic.  Eyes: Conjunctivae normal are normal.  Neck: Normal range of motion. Neck supple.  Pulmonary/Chest: No respiratory distress.  Abdominal:       Five healing abdominal incisions. Centermost incision has approximately 2-3 mm area which glue was worn away and is slightly opened. No drainage. No surrounding redness or erythema. Remaining wounds intact.  Neurological: She is alert.  Skin: Skin is warm and dry.  Psychiatric: She has a normal mood and affect.    ED Course  Procedures (including critical care time)  Labs Reviewed - No data to display No results found.   1. Postoperative complication     5:27 PM Patient seen and examined. Dermabond applied to lower aspect of surgical wound to reinforce. Patient urged to f/u with GYN prn.  Vital signs reviewed and are as follows: Filed Vitals:   09/16/12 1515  BP: 114/77  Pulse: 88  Temp: 98.7 F (37.1 C)   The patient was urged to return to the Emergency Department urgently with  worsening pain, swelling, expanding erythema especially if it streaks away from the affected area, fever, or if they have any other concerns. Patient verbalized understanding.   MDM  Post-op complication. No evidence of infection. Routine follow-up indicated. Did not close opening, however reinforced bottom of wound to prevent further dehiscence.       Renne Crigler, [place] 09/16/12 1734

## 2012-09-16 NOTE — ED Notes (Signed)
Pt states she had a myomectomy Friday (4 days ago) and one of her incisions has now lost some glue. Incision is only slightly exposed where glue has come off. No signs of infection present. Denies fever. Has been taking showers but not baths, as she was directed.

## 2012-09-16 NOTE — ED Provider Notes (Signed)
Medical screening examination/treatment/procedure(s) were performed by non-physician practitioner and as supervising physician I was immediately available for consultation/collaboration.   Zoa Dowty Y. Jinnie Onley, MD 09/16/12 2311 

## 2014-03-28 ENCOUNTER — Inpatient Hospital Stay (HOSPITAL_COMMUNITY)
Admission: AD | Admit: 2014-03-28 | Discharge: 2014-03-28 | Disposition: A | Discharge status: Home / Self Care | Payer: Managed Care, Other (non HMO) | Source: Ambulatory Visit | Attending: Obstetrics & Gynecology | Admitting: Obstetrics & Gynecology

## 2014-03-28 ENCOUNTER — Encounter (HOSPITAL_COMMUNITY): Payer: Self-pay | Admitting: *Deleted

## 2014-03-28 DIAGNOSIS — N949 Unspecified condition associated with female genital organs and menstrual cycle: Secondary | ICD-10-CM | POA: Insufficient documentation

## 2014-03-28 DIAGNOSIS — N939 Abnormal uterine and vaginal bleeding, unspecified: Secondary | ICD-10-CM

## 2014-03-28 DIAGNOSIS — D259 Leiomyoma of uterus, unspecified: Secondary | ICD-10-CM | POA: Insufficient documentation

## 2014-03-28 DIAGNOSIS — N925 Other specified irregular menstruation: Secondary | ICD-10-CM | POA: Insufficient documentation

## 2014-03-28 DIAGNOSIS — N926 Irregular menstruation, unspecified: Secondary | ICD-10-CM | POA: Insufficient documentation

## 2014-03-28 DIAGNOSIS — N938 Other specified abnormal uterine and vaginal bleeding: Secondary | ICD-10-CM | POA: Insufficient documentation

## 2014-03-28 DIAGNOSIS — E282 Polycystic ovarian syndrome: Secondary | ICD-10-CM | POA: Insufficient documentation

## 2014-03-28 DIAGNOSIS — N390 Urinary tract infection, site not specified: Secondary | ICD-10-CM | POA: Insufficient documentation

## 2014-03-28 DIAGNOSIS — E119 Type 2 diabetes mellitus without complications: Secondary | ICD-10-CM | POA: Insufficient documentation

## 2014-03-28 HISTORY — DX: Polycystic ovarian syndrome: E28.2

## 2014-03-28 LAB — CBC
HEMATOCRIT: 38.1 % (ref 36.0–46.0)
Hemoglobin: 12.8 g/dL (ref 12.0–15.0)
MCH: 27.8 pg (ref 26.0–34.0)
MCHC: 33.6 g/dL (ref 30.0–36.0)
MCV: 82.6 fL (ref 78.0–100.0)
PLATELETS: 298 10*3/uL (ref 150–400)
RBC: 4.61 MIL/uL (ref 3.87–5.11)
RDW: 13.2 % (ref 11.5–15.5)
WBC: 9.3 10*3/uL (ref 4.0–10.5)

## 2014-03-28 LAB — URINE MICROSCOPIC-ADD ON

## 2014-03-28 LAB — URINALYSIS, ROUTINE W REFLEX MICROSCOPIC
Bilirubin Urine: NEGATIVE
GLUCOSE, UA: NEGATIVE mg/dL
KETONES UR: NEGATIVE mg/dL
Nitrite: POSITIVE — AB
PROTEIN: 100 mg/dL — AB
Specific Gravity, Urine: 1.02 (ref 1.005–1.030)
UROBILINOGEN UA: 1 mg/dL (ref 0.0–1.0)
pH: 6.5 (ref 5.0–8.0)

## 2014-03-28 LAB — POCT PREGNANCY, URINE: Preg Test, Ur: NEGATIVE

## 2014-03-28 MED ORDER — SULFAMETHOXAZOLE-TRIMETHOPRIM 800-160 MG PO TABS: 1.0000 | ORAL_TABLET | Freq: Two times a day (BID) | ORAL | Status: AC

## 2014-03-28 MED ORDER — MEDROXYPROGESTERONE ACETATE 10 MG PO TABS: 10.0000 mg | ORAL_TABLET | Freq: Every day | ORAL | Status: DC

## 2014-03-28 NOTE — MAU Note (Signed)
Pt presents to MAU for complaints of heavy vaginal bleeding for 2 weeks. States a history of PCOS.

## 2014-03-28 NOTE — Discharge Instructions (Signed)
Abnormal Uterine Bleeding Abnormal uterine bleeding can affect women at various stages in life, including teenagers, women in their reproductive years, pregnant women, and women who have reached menopause. Several kinds of uterine bleeding are considered abnormal, including:  Bleeding or spotting between periods.   Bleeding after sexual intercourse.   Bleeding that is heavier or more than normal.   Periods that last longer than usual.  Bleeding after menopause.  Many cases of abnormal uterine bleeding are minor and simple to treat, while others are more serious. Any type of abnormal bleeding should be evaluated by your health care provider. Treatment will depend on the cause of the bleeding. HOME CARE INSTRUCTIONS Monitor your condition for any changes. The following actions may help to alleviate any discomfort you are experiencing:  Avoid the use of tampons and douches as directed by your health care provider.  Change your pads frequently. You should get regular pelvic exams and Pap tests. Keep all follow-up appointments for diagnostic tests as directed by your health care provider.  SEEK MEDICAL CARE IF:   Your bleeding lasts more than 1 week.   You feel dizzy at times.  SEEK IMMEDIATE MEDICAL CARE IF:   You pass out.   You are changing pads every 15 to 30 minutes.   You have abdominal pain.  You have a fever.   You become sweaty or weak.   You are passing large blood clots from the vagina.   You start to feel nauseous and vomit. MAKE SURE YOU:   Understand these instructions.  Will watch your condition.  Will get help right away if you are not doing well or get worse. Document Released: 11/26/2005 Document Revised: 07/29/2013 Document Reviewed: 06/25/2013 [facility] Patient Information 2014 [place], [place].  Endometrial Biopsy Endometrial biopsy is a procedure in which a tissue sample is taken from inside the uterus. The tissue sample is then looked at  under a microscope to see if the tissue is normal or abnormal. The endometrium is the lining of the uterus. This procedure helps determine where you are in your menstrual cycle and how hormone levels are affecting the lining of the uterus. This procedure may also be used to evaluate uterine bleeding or to diagnose endometrial cancer, tuberculosis, polyps, or inflammatory conditions.  LET [facility] CARE PROVIDER KNOW ABOUT:  Any allergies you have.  All medicines you are taking, including vitamins, herbs, eye drops, creams, and over-the-counter medicines.  Previous problems you or members of your family have had with the use of anesthetics.  Any blood disorders you have.  Previous surgeries you have had.  Medical conditions you have.  Possibility of pregnancy. RISKS AND COMPLICATIONS Generally, this is a safe procedure. However, as with any procedure, complications can occur. Possible complications include:  Bleeding.  Pelvic infection.  Puncture of the uterine wall with the biopsy device (rare). BEFORE THE PROCEDURE   Keep a record of your menstrual cycles as directed by your health care provider. You may need to schedule your procedure for a specific time in your cycle.  You may want to bring a sanitary pad to wear home after the procedure.  Arrange for someone to drive you home after the procedure if you will be given a medicine to help you relax (sedative). PROCEDURE   You may be given a sedative to relax you.  You will lie on an exam table with your feet and legs supported as in a pelvic exam.  Your health care provider will insert an  instrument (speculum) into your vagina to see your cervix.  Your cervix will be cleansed with an antiseptic solution. A medicine (local anesthetic) will be used to numb the cervix.  A forceps instrument (tenaculum) will be used to hold your cervix steady for the biopsy.  A thin, rodlike instrument (uterine sound) will be inserted  through your cervix to determine the length of your uterus and the location where the biopsy sample will be removed.  A thin, flexible tube (catheter) will be inserted through your cervix and into the uterus. The catheter is used to collect the biopsy sample from your endometrial tissue.  The catheter and speculum will then be removed, and the tissue sample will be sent to a lab for examination. AFTER THE PROCEDURE  You will rest in a recovery area until you are ready to go home.  You may have mild cramping and a small amount of vaginal bleeding for a few days after the procedure. This is normal.  Make sure you find out how to get your test results. Document Released: 03/29/2005 Document Revised: 07/29/2013 Document Reviewed: 05/13/2013 [facility] Patient Information 2014 [place], [place].

## 2014-03-28 NOTE — MAU Provider Note (Signed)
History     CSN: [id number]  Arrival date and time: 03/28/14 1354   First Provider Initiated Contact with Patient 03/28/14 1535      Chief Complaint  Patient presents with  . Vaginal Bleeding   HPI [name] is a 33 y.o. G0P0 who presents to MAU today with complaint of vaginal bleeding x 2 weeks. She has a history of irregular periods secondary to PCOS. She also had myomectomy in 2013. She states that she recently took herself off of Metformin because she didn't go back to PCP for refills. The patient is also a type 2 DM. She states heavy bleeding worse in the mornings with clots today. She occasionally feels weak, but denies dizziness or LOC. She denies pain, fever, UTI symptoms or flank pain. She has not been sexually active in over 1 year.   OB History   Grav Para Term Preterm Abortions TAB SAB Ect Mult Living   0               Past Medical History  Diagnosis Date  . Diabetes mellitus   . Fibroids   . Obesity   . PCOS (polycystic ovarian syndrome)     Past Surgical History  Procedure Laterality Date  . Robot assisted myomectomy  09/12/2012    Procedure: ROBOTIC ASSISTED MYOMECTOMY;  Surgeon: Lahoma Crocker, MD;  Location: [facility];  Service: Gynecology;  Laterality: N/A;    History reviewed. No pertinent family history.  History  Substance Use Topics  . Smoking status: Never Smoker   . Smokeless tobacco: Never Used  . Alcohol Use: Yes     Comment: socially     Allergies:  Allergies  Allergen Reactions  . Pineapple Other (See Comments)    Allergy from when she was younger, cause her to break out, not sure if it still an allergy today    No prescriptions prior to admission    Review of Systems  Constitutional: Negative for fever and malaise/fatigue.  Gastrointestinal: Negative for nausea, vomiting, abdominal pain, diarrhea and constipation.  Genitourinary: Negative for dysuria, urgency and frequency.       + vaginal bleeding  Neurological:  Positive for weakness. Negative for dizziness and loss of consciousness.   Physical Exam   Blood pressure 108/74, pulse 92, temperature 98 F (36.7 C), temperature source Oral, resp. rate 16, height 5\' 1"  (1.549 m), weight 134.718 kg (297 lb), last menstrual period 03/12/2014.  Physical Exam  Constitutional: She is oriented to person, place, and time. She appears well-developed and well-nourished. No distress.  HENT:  Head: Normocephalic and atraumatic.  Cardiovascular: Normal rate.   Respiratory: Effort normal.  GI: Soft. She exhibits no distension and no mass. There is no tenderness. There is no rebound and no guarding.  Genitourinary: Uterus is not enlarged and not tender. Cervix exhibits no motion tenderness, no discharge and no friability. Right adnexum displays no mass and no tenderness. Left adnexum displays no mass. There is bleeding (scant blood in the vaginal vault) around the vagina. No vaginal discharge found.  Neurological: She is alert and oriented to person, place, and time.  Skin: Skin is warm and dry. No erythema.  Psychiatric: She has a normal mood and affect.   Results for orders placed during the hospital encounter of 03/28/14 (from the past 24 hour(s))  CBC     Status: None   Collection Time    03/28/14  2:05 PM      Result Value Ref Range  WBC 9.3  4.0 - 10.5 K/uL   RBC 4.61  3.87 - 5.11 MIL/uL   Hemoglobin 12.8  12.0 - 15.0 g/dL   HCT 38.1  36.0 - 46.0 %   MCV 82.6  78.0 - 100.0 fL   MCH 27.8  26.0 - 34.0 pg   MCHC 33.6  30.0 - 36.0 g/dL   RDW 13.2  11.5 - 15.5 %   Platelets 298  150 - 400 K/uL  POCT PREGNANCY, URINE     Status: None   Collection Time    03/28/14  2:22 PM      Result Value Ref Range   Preg Test, Ur NEGATIVE  NEGATIVE  URINALYSIS, ROUTINE W REFLEX MICROSCOPIC     Status: Abnormal   Collection Time    03/28/14  2:40 PM      Result Value Ref Range   Color, Urine RED (*) YELLOW   APPearance HAZY (*) CLEAR   Specific Gravity, Urine 1.020   1.005 - 1.030   pH 6.5  5.0 - 8.0   Glucose, UA NEGATIVE  NEGATIVE mg/dL   Hgb urine dipstick LARGE (*) NEGATIVE   Bilirubin Urine NEGATIVE  NEGATIVE   Ketones, ur NEGATIVE  NEGATIVE mg/dL   Protein, ur 100 (*) NEGATIVE mg/dL   Urobilinogen, UA 1.0  0.0 - 1.0 mg/dL   Nitrite POSITIVE (*) NEGATIVE   Leukocytes, UA TRACE (*) NEGATIVE  URINE MICROSCOPIC-ADD ON     Status: Abnormal   Collection Time    03/28/14  2:40 PM      Result Value Ref Range   WBC, UA 0-2  <3 WBC/hpf   RBC / HPF TOO NUMEROUS TO COUNT  <3 RBC/hpf   Bacteria, UA FEW (*) RARE    MAU Course  Procedures None  MDM UPT - negative UA, CBC today  Assessment and Plan  A: UTI AUB  P: Discharge home Rx for Provera and Cipro given to patient Bleeding precautions discussed Patient advised to incerease PO hydration Discussed warning signs for pyelonephritis Patient encourage to scheduled follow-p with Dr. Delsa Sale as soon as possible  Patient may return to MAU as needed or if her condition were to change or worsen  Farris Has, PA-C  03/28/2014, 5:13 PM

## 2014-04-20 ENCOUNTER — Encounter: Payer: Self-pay | Admitting: Obstetrics & Gynecology

## 2014-04-20 ENCOUNTER — Ambulatory Visit (INDEPENDENT_AMBULATORY_CARE_PROVIDER_SITE_OTHER): Payer: Managed Care, Other (non HMO) | Admitting: Obstetrics & Gynecology

## 2014-04-20 VITALS — BP 98/61 | HR 79 | Ht 62.0 in | Wt 296.0 lb

## 2014-04-20 DIAGNOSIS — N946 Dysmenorrhea, unspecified: Secondary | ICD-10-CM | POA: Insufficient documentation

## 2014-04-20 DIAGNOSIS — N926 Irregular menstruation, unspecified: Secondary | ICD-10-CM

## 2014-04-20 DIAGNOSIS — D259 Leiomyoma of uterus, unspecified: Secondary | ICD-10-CM

## 2014-04-20 DIAGNOSIS — N939 Abnormal uterine and vaginal bleeding, unspecified: Secondary | ICD-10-CM

## 2014-04-20 DIAGNOSIS — E282 Polycystic ovarian syndrome: Secondary | ICD-10-CM | POA: Insufficient documentation

## 2014-04-20 MED ORDER — MEGESTROL ACETATE 40 MG PO TABS: ORAL_TABLET | ORAL | Status: DC

## 2014-04-20 MED ORDER — NAPROXEN SODIUM ER 500 MG PO TB24: 500.0000 mg | ORAL_TABLET | Freq: Every day | ORAL | Status: DC

## 2014-04-20 MED ORDER — METFORMIN HCL ER 750 MG PO TB24: 750.0000 mg | ORAL_TABLET | Freq: Two times a day (BID) | ORAL | Status: DC

## 2014-04-20 NOTE — Progress Notes (Signed)
   CLINIC ENCOUNTER NOTE  History:  33 y.o. G0P0 here today for evaluation of irregular bleeding and dysmenorrhea. Has history of PCOS, has long history of AUB. Also has a history of robotic myomectomy in 2013.  She feels her dysmenorrhea has gradually been getting worse and her AUB has been also worsening. She wonders if her fibroids have recurred.  The following portions of the patient's history were reviewed and updated as appropriate: allergies, current medications, past family history, past medical history, past social history, past surgical history and problem list. Cannot remember when her last pap was.  Review of Systems:  Pertinent items are noted in HPI.  Objective:  Physical Exam BP 98/61  Pulse 79  Ht 5\' 2"  (1.575 m)  Wt 296 lb (134.265 kg)  BMI 54.13 kg/m2  LMP 03/12/2014 Gen: NAD Abd: Soft, obese, nontender and nondistended Pelvic: Normal appearing external genitalia; normal appearing vaginal mucosa and cervix.  Normal discharge.  Unable to palpate uterus or adnexa due to habitus.   Assessment & Plan:  Pelvic ultrasound ordered Metformin refilled; but at correct dosing for PCOS (1500 mg/day) Megace prescribed as needed; Naproxen also prescribed for pain. Follow up to discuss results and annual exam    Verita Schneiders, MD, [facility] Attending [place], [place]

## 2014-04-20 NOTE — Patient Instructions (Signed)
Return to clinic for any scheduled appointments or for any gynecologic concerns as needed.   

## 2014-04-26 ENCOUNTER — Ambulatory Visit (HOSPITAL_COMMUNITY)
Admission: RE | Admit: 2014-04-26 | Discharge: 2014-04-26 | Disposition: A | Discharge status: Home / Self Care | Payer: Managed Care, Other (non HMO) | Source: Ambulatory Visit | Attending: Obstetrics & Gynecology | Admitting: Obstetrics & Gynecology

## 2014-04-26 DIAGNOSIS — D259 Leiomyoma of uterus, unspecified: Secondary | ICD-10-CM

## 2014-04-26 DIAGNOSIS — N938 Other specified abnormal uterine and vaginal bleeding: Secondary | ICD-10-CM | POA: Insufficient documentation

## 2014-04-26 DIAGNOSIS — E282 Polycystic ovarian syndrome: Secondary | ICD-10-CM

## 2014-04-26 DIAGNOSIS — N949 Unspecified condition associated with female genital organs and menstrual cycle: Secondary | ICD-10-CM | POA: Insufficient documentation

## 2014-05-04 ENCOUNTER — Telehealth: Payer: Self-pay | Admitting: *Deleted

## 2014-05-04 MED ORDER — NAPROXEN 500 MG PO TABS: 500.0000 mg | ORAL_TABLET | Freq: Two times a day (BID) | ORAL | Status: DC

## 2014-05-04 NOTE — Telephone Encounter (Signed)
Patient needs medication called in for naprosyn called in as the twice a day tablet instead of the 24 hour extended release as this was going to cost 400.00.  I have called in naprosyn 500mg  bid.

## 2014-05-05 ENCOUNTER — Ambulatory Visit: Payer: Self-pay | Admitting: Obstetrics & Gynecology

## 2014-05-11 ENCOUNTER — Ambulatory Visit: Payer: Self-pay | Admitting: Family Medicine

## 2014-05-19 ENCOUNTER — Encounter: Payer: Self-pay | Admitting: Obstetrics & Gynecology

## 2014-05-19 ENCOUNTER — Ambulatory Visit (INDEPENDENT_AMBULATORY_CARE_PROVIDER_SITE_OTHER): Payer: Managed Care, Other (non HMO) | Admitting: Obstetrics & Gynecology

## 2014-05-19 VITALS — BP 103/61 | HR 86 | Ht 62.0 in | Wt 299.8 lb

## 2014-05-19 DIAGNOSIS — E282 Polycystic ovarian syndrome: Secondary | ICD-10-CM

## 2014-05-19 DIAGNOSIS — Z124 Encounter for screening for malignant neoplasm of cervix: Secondary | ICD-10-CM

## 2014-05-19 DIAGNOSIS — D259 Leiomyoma of uterus, unspecified: Secondary | ICD-10-CM

## 2014-05-19 DIAGNOSIS — Z01419 Encounter for gynecological examination (general) (routine) without abnormal findings: Secondary | ICD-10-CM

## 2014-05-19 DIAGNOSIS — Z0000 Encounter for general adult medical examination without abnormal findings: Secondary | ICD-10-CM

## 2014-05-19 DIAGNOSIS — Z1151 Encounter for screening for human papillomavirus (HPV): Secondary | ICD-10-CM

## 2014-05-19 DIAGNOSIS — Z113 Encounter for screening for infections with a predominantly sexual mode of transmission: Secondary | ICD-10-CM

## 2014-05-19 MED ORDER — MEGESTROL ACETATE 40 MG PO TABS: ORAL_TABLET | ORAL | Status: DC

## 2014-05-19 MED ORDER — METFORMIN HCL ER 750 MG PO TB24: 750.0000 mg | ORAL_TABLET | Freq: Two times a day (BID) | ORAL | Status: DC

## 2014-05-19 MED ORDER — NAPROXEN SODIUM ER 500 MG PO TB24: 500.0000 mg | ORAL_TABLET | Freq: Two times a day (BID) | ORAL | Status: DC | PRN

## 2014-05-19 NOTE — Patient Instructions (Signed)
MyChart allows you to send messages to your doctor, view your lab results (as released by your physician), manage appointments, and more. To sign up, log on to [email or web address] using the Address Bar in your browser. Once you are logged on, click on the Sign Up Now link and you will access the new member signup page. Enter your MyChart Activation Code exactly as it appears below to complete the sign-up process. If you do not sign up before the expiration date, you must request a new code.  MyChart Activation Code: [email or web address] Expires: 05/27/2014  4:00 PM  If you have questions, you can call [phone]) or e-mail [email or web address]  to talk to our [facility] staff. Remember, MyChart is NOT to be used for urgent needs. For medical emergencies, dial 911.  Preventive Care for Adults, Female A healthy lifestyle and preventive care can promote health and wellness. Preventive health guidelines for women include the following key practices.  A routine yearly physical is a good way to check with your health care provider about your health and preventive screening. It is a chance to share any concerns and updates on your health and to receive a thorough exam.  Visit your dentist for a routine exam and preventive care every 6 months. Brush your teeth twice a day and floss once a day. Good oral hygiene prevents tooth decay and gum disease.  The frequency of eye exams is based on your age, health, family medical history, use of contact lenses, and other factors. Follow your health care provider's recommendations for frequency of eye exams.  Eat a healthy diet. Foods like vegetables, fruits, whole grains, low-fat dairy products, and lean protein foods contain the nutrients you need without too many calories. Decrease your intake of foods high in solid fats, added sugars, and salt. Eat the right amount of calories for you.Get information about a proper diet from your health  care provider, if necessary.  Regular physical exercise is one of the most important things you can do for your health. Most adults should get at least 150 minutes of moderate-intensity exercise (any activity that increases your heart rate and causes you to sweat) each week. In addition, most adults need muscle-strengthening exercises on 2 or more days a week.  Maintain a healthy weight. The body mass index (BMI) is a screening tool to identify possible weight problems. It provides an estimate of body fat based on height and weight. Your health care provider can find your BMI, and can help you achieve or maintain a healthy weight.For adults 20 years and older:  A BMI below 18.5 is considered underweight.  A BMI of 18.5 to 24.9 is normal.  A BMI of 25 to 29.9 is considered overweight.  A BMI of 30 and above is considered obese.  Maintain normal blood lipids and cholesterol levels by exercising and minimizing your intake of saturated fat. Eat a balanced diet with plenty of fruit and vegetables. Blood tests for lipids and cholesterol should begin at age 54 and be repeated every 5 years. If your lipid or cholesterol levels are high, you are over 50, or you are at high risk for heart disease, you may need your cholesterol levels checked more frequently.Ongoing high lipid and cholesterol levels should be treated with medicines if diet and exercise are not working.  If you smoke, find out from your health care provider how to quit. If you do not use tobacco, do not start.  Lung cancer screening is recommended for adults aged  72 80 years who are at high risk for developing lung cancer because of a history of smoking. A yearly low-dose CT scan of the lungs is recommended for people who have at least a 30-pack-year history of smoking and are a current smoker or have quit within the past 15 years. A pack year of smoking is smoking an average of 1 pack of cigarettes a day for 1 year (for example: 1 pack a  day for 30 years or 2 packs a day for 15 years). Yearly screening should continue until the smoker has stopped smoking for at least 15 years. Yearly screening should be stopped for people who develop a health problem that would prevent them from having lung cancer treatment.  If you are pregnant, do not drink alcohol. If you are breastfeeding, be very cautious about drinking alcohol. If you are not pregnant and choose to drink alcohol, do not have more than 1 drink per day. One drink is considered to be 12 ounces (355 mL) of beer, 5 ounces (148 mL) of wine, or 1.5 ounces (44 mL) of liquor.  Avoid use of street drugs. Do not share needles with anyone. Ask for help if you need support or instructions about stopping the use of drugs.  High blood pressure causes heart disease and increases the risk of stroke. Your blood pressure should be checked at least every 1 to 2 years. Ongoing high blood pressure should be treated with medicines if weight loss and exercise do not work.  If you are 18 34 years old, ask your health care provider if you should take aspirin to prevent strokes.  Diabetes screening involves taking a blood sample to check your fasting blood sugar level. This should be done once every 3 years, after age 69, if you are within normal weight and without risk factors for diabetes. Testing should be considered at a younger age or be carried out more frequently if you are overweight and have at least 1 risk factor for diabetes.  Breast cancer screening is essential preventive care for women. You should practice "breast self-awareness." This means understanding the normal appearance and feel of your breasts and may include breast self-examination. Any changes detected, no matter how small, should be reported to a health care provider. Women in their 22s and 30s should have a clinical breast exam (CBE) by a health care provider as part of a regular health exam every 1 to 3 years. After age 61, women  should have a CBE every year. Starting at age 69, women should consider having a mammogram (breast X-ray test) every year. Women who have a family history of breast cancer should talk to their health care provider about genetic screening. Women at a high risk of breast cancer should talk to their health care providers about having an MRI and a mammogram every year.  Breast cancer gene (BRCA)-related cancer risk assessment is recommended for women who have family members with BRCA-related cancers. BRCA-related cancers include breast, ovarian, tubal, and peritoneal cancers. Having family members with these cancers may be associated with an increased risk for harmful changes (mutations) in the breast cancer genes BRCA1 and BRCA2. Results of the assessment will determine the need for genetic counseling and BRCA1 and BRCA2 testing.  The Pap test is a screening test for cervical cancer. A Pap test can show cell changes on the cervix that might become cervical cancer if left untreated. A Pap test is a procedure in which cells are obtained and examined  from the lower end of the uterus (cervix).  Women should have a Pap test starting at age 2.  Between ages 78 and 94, Pap tests should be repeated every 2 years.  Beginning at age 38, you should have a Pap test every 3 years as long as the past 3 Pap tests have been normal.  Some women have medical problems that increase the chance of getting cervical cancer. Talk to your health care provider about these problems. It is especially important to talk to your health care provider if a new problem develops soon after your last Pap test. In these cases, your health care provider may recommend more frequent screening and Pap tests.  The above recommendations are the same for women who have or have not gotten the vaccine for human papillomavirus (HPV).  If you had a hysterectomy for a problem that was not cancer or a condition that could lead to cancer, then you no  longer need Pap tests. Even if you no longer need a Pap test, a regular exam is a good idea to make sure no other problems are starting.  If you are between ages 68 and 31 years, and you have had normal Pap tests going back 10 years, you no longer need Pap tests. Even if you no longer need a Pap test, a regular exam is a good idea to make sure no other problems are starting.  If you have had past treatment for cervical cancer or a condition that could lead to cancer, you need Pap tests and screening for cancer for at least 20 years after your treatment.  If Pap tests have been discontinued, risk factors (such as a new sexual partner) need to be reassessed to determine if screening should be resumed.  The HPV test is an additional test that may be used for cervical cancer screening. The HPV test looks for the virus that can cause the cell changes on the cervix. The cells collected during the Pap test can be tested for HPV. The HPV test could be used to screen women aged 58 years and older, and should be used in women of any age who have unclear Pap test results. After the age of 38, women should have HPV testing at the same frequency as a Pap test.  Colorectal cancer can be detected and often prevented. Most routine colorectal cancer screening begins at the age of 25 years and continues through age 67 years. However, your health care provider may recommend screening at an earlier age if you have risk factors for colon cancer. On a yearly basis, your health care provider may provide home test kits to check for hidden blood in the stool. Use of a small camera at the end of a tube, to directly examine the colon (sigmoidoscopy or colonoscopy), can detect the earliest forms of colorectal cancer. Talk to your health care provider about this at age 66, when routine screening begins. Direct exam of the colon should be repeated every 5 10 years through age 86 years, unless early forms of pre-cancerous polyps or  small growths are found.  People who are at an increased risk for hepatitis B should be screened for this virus. You are considered at high risk for hepatitis B if:  You were born in a country where hepatitis B occurs often. Talk with your health care provider about which countries are considered high risk.  Your parents were born in a high-risk country and you have not received a shot  to protect against hepatitis B (hepatitis B vaccine).  You have HIV or AIDS.  You use needles to inject street drugs.  You live with, or have sex with, someone who has Hepatitis B.  You get hemodialysis treatment.  You take certain medicines for conditions like cancer, organ transplantation, and autoimmune conditions.  Hepatitis C blood testing is recommended for all people born from [birth year] through 1966 and any individual with known risks for hepatitis C.  Practice safe sex. Use condoms and avoid high-risk sexual practices to reduce the spread of sexually transmitted infections (STIs). STIs include gonorrhea, chlamydia, syphilis, trichomonas, herpes, HPV, and human immunodeficiency virus (HIV). Herpes, HIV, and HPV are viral illnesses that have no cure. They can result in disability, cancer, and death. Sexually active women aged 25 years and younger should be checked for chlamydia. Older women with new or multiple partners should also be tested for chlamydia. Testing for other STIs is recommended if you are sexually active and at increased risk.  Osteoporosis is a disease in which the bones lose minerals and strength with aging. This can result in serious bone fractures or breaks. The risk of osteoporosis can be identified using a bone density scan. Women ages 65 years and over and women at risk for fractures or osteoporosis should discuss screening with their health care providers. Ask your health care provider whether you should take a calcium supplement or vitamin D to reduce the rate of  osteoporosis.  Menopause can be associated with physical symptoms and risks. Hormone replacement therapy is available to decrease symptoms and risks. You should talk to your health care provider about whether hormone replacement therapy is right for you.  Use sunscreen. Apply sunscreen liberally and repeatedly throughout the day. You should seek shade when your shadow is shorter than you. Protect yourself by wearing long sleeves, pants, a wide-brimmed hat, and sunglasses year round, whenever you are outdoors.  Once a month, do a whole body skin exam, using a mirror to look at the skin on your back. Tell your health care provider of new moles, moles that have irregular borders, moles that are larger than a pencil eraser, or moles that have changed in shape or color.  Stay current with required vaccines (immunizations).  Influenza vaccine. All adults should be immunized every year.  Tetanus, diphtheria, and acellular pertussis (Td, Tdap) vaccine. Pregnant women should receive 1 dose of Tdap vaccine during each pregnancy. The dose should be obtained regardless of the length of time since the last dose. Immunization is preferred during the 27th 36th week of gestation. An adult who has not previously received Tdap or who does not know her vaccine status should receive 1 dose of Tdap. This initial dose should be followed by tetanus and diphtheria toxoids (Td) booster doses every 10 years. Adults with an unknown or incomplete history of completing a 3-dose immunization series with Td-containing vaccines should begin or complete a primary immunization series including a Tdap dose. Adults should receive a Td booster every 10 years.  Varicella vaccine. An adult without evidence of immunity to varicella should receive 2 doses or a second dose if she has previously received 1 dose. Pregnant females who do not have evidence of immunity should receive the first dose after pregnancy. This first dose should be  obtained before leaving the health care facility. The second dose should be obtained 4 8 weeks after the first dose.  Human papillomavirus (HPV) vaccine. Females aged 69 26 years who have not received  the vaccine previously should obtain the 3-dose series. The vaccine is not recommended for use in pregnant females. However, pregnancy testing is not needed before receiving a dose. If a female is found to be pregnant after receiving a dose, no treatment is needed. In that case, the remaining doses should be delayed until after the pregnancy. Immunization is recommended for any person with an immunocompromised condition through the age of 39 years if she did not get any or all doses earlier. During the 3-dose series, the second dose should be obtained 4 8 weeks after the first dose. The third dose should be obtained 24 weeks after the first dose and 16 weeks after the second dose.  Zoster vaccine. One dose is recommended for adults aged 24 years or older unless certain conditions are present.  Measles, mumps, and rubella (MMR) vaccine. Adults born before [birth year] generally are considered immune to measles and mumps. Adults born in [birth year] or later should have 1 or more doses of MMR vaccine unless there is a contraindication to the vaccine or there is laboratory evidence of immunity to each of the three diseases. A routine second dose of MMR vaccine should be obtained at least 28 days after the first dose for students attending postsecondary schools, health care workers, or international travelers. People who received inactivated measles vaccine or an unknown type of measles vaccine during 1963 1967 should receive 2 doses of MMR vaccine. People who received inactivated mumps vaccine or an unknown type of mumps vaccine before 1979 and are at high risk for mumps infection should consider immunization with 2 doses of MMR vaccine. For females of childbearing age, rubella immunity should be determined. If there is no evidence  of immunity, females who are not pregnant should be vaccinated. If there is no evidence of immunity, females who are pregnant should delay immunization until after pregnancy. Unvaccinated health care workers born before 3 who lack laboratory evidence of measles, mumps, or rubella immunity or laboratory confirmation of disease should consider measles and mumps immunization with 2 doses of MMR vaccine or rubella immunization with 1 dose of MMR vaccine.  Pneumococcal 13-valent conjugate (PCV13) vaccine. When indicated, a person who is uncertain of her immunization history and has no record of immunization should receive the PCV13 vaccine. An adult aged 34 years or older who has certain medical conditions and has not been previously immunized should receive 1 dose of PCV13 vaccine. This PCV13 should be followed with a dose of pneumococcal polysaccharide (PPSV23) vaccine. The PPSV23 vaccine dose should be obtained at least 8 weeks after the dose of PCV13 vaccine. An adult aged 37 years or older who has certain medical conditions and previously received 1 or more doses of PPSV23 vaccine should receive 1 dose of PCV13. The PCV13 vaccine dose should be obtained 1 or more years after the last PPSV23 vaccine dose.  Pneumococcal polysaccharide (PPSV23) vaccine. When PCV13 is also indicated, PCV13 should be obtained first. All adults aged 64 years and older should be immunized. An adult younger than age 99 years who has certain medical conditions should be immunized. Any person who resides in a nursing home or long-term care facility should be immunized. An adult smoker should be immunized. People with an immunocompromised condition and certain other conditions should receive both PCV13 and PPSV23 vaccines. People with human immunodeficiency virus (HIV) infection should be immunized as soon as possible after diagnosis. Immunization during chemotherapy or radiation therapy should be avoided. Routine use of PPSV23 vaccine  is  not recommended for American Indians, [place] Natives, or people younger than 87 years unless there are medical conditions that require PPSV23 vaccine. When indicated, people who have unknown immunization and have no record of immunization should receive PPSV23 vaccine. One-time revaccination 5 years after the first dose of PPSV23 is recommended for people aged 29 64 years who have chronic kidney failure, nephrotic syndrome, asplenia, or immunocompromised conditions. People who received 1 2 doses of PPSV23 before age 9 years should receive another dose of PPSV23 vaccine at age 10 years or later if at least 5 years have passed since the previous dose. Doses of PPSV23 are not needed for people immunized with PPSV23 at or after age 29 years.  Meningococcal vaccine. Adults with asplenia or persistent complement component deficiencies should receive 2 doses of quadrivalent meningococcal conjugate (MenACWY-D) vaccine. The doses should be obtained at least 2 months apart. Microbiologists working with certain meningococcal bacteria, [place] recruits, people at risk during an outbreak, and people who travel to or live in countries with a high rate of meningitis should be immunized. A first-year college student up through age 16 years who is living in a residence hall should receive a dose if she did not receive a dose on or after her 16th birthday. Adults who have certain high-risk conditions should receive one or more doses of vaccine.  Hepatitis A vaccine. Adults who wish to be protected from this disease, have certain high-risk conditions, work with hepatitis A-infected animals, work in hepatitis A research labs, or travel to or work in countries with a high rate of hepatitis A should be immunized. Adults who were previously unvaccinated and who anticipate close contact with an international adoptee during the first 60 days after arrival in the [place] from a country with a high rate of hepatitis A should be  immunized.  Hepatitis B vaccine. Adults who wish to be protected from this disease, have certain high-risk conditions, may be exposed to blood or other infectious body fluids, are household contacts or sex partners of hepatitis B positive people, are clients or workers in certain care facilities, or travel to or work in countries with a high rate of hepatitis B should be immunized.  Haemophilus influenzae type b (Hib) vaccine. A previously unvaccinated person with asplenia or sickle cell disease or having a scheduled splenectomy should receive 1 dose of Hib vaccine. Regardless of previous immunization, a recipient of a hematopoietic stem cell transplant should receive a 3-dose series 6 12 months after her successful transplant. Hib vaccine is not recommended for adults with HIV infection. Preventive Services / Frequency Ages 47 to 39years  Blood pressure check.** / Every 1 to 2 years.  Lipid and cholesterol check.** / Every 5 years beginning at age 54.  Clinical breast exam.** / Every 3 years for women in their 17s and 43s.  BRCA-related cancer risk assessment.** / For women who have family members with a BRCA-related cancer (breast, ovarian, tubal, or peritoneal cancers).  Pap test.** / Every 2 years from ages 3 through 68. Every 3 years starting at age 43 through age 22 or 48 with a history of 3 consecutive normal Pap tests.  HPV screening.** / Every 3 years from ages 61 through ages 20 to 46 with a history of 3 consecutive normal Pap tests.  Hepatitis C blood test.** / For any individual with known risks for hepatitis C.  Skin self-exam. / Monthly.  Influenza vaccine. / Every year.  Tetanus, diphtheria, and acellular pertussis (Tdap, Td)  vaccine.** / Consult your health care provider. Pregnant women should receive 1 dose of Tdap vaccine during each pregnancy. 1 dose of Td every 10 years.  Varicella vaccine.** / Consult your health care provider. Pregnant females who do not have  evidence of immunity should receive the first dose after pregnancy.  HPV vaccine. / 3 doses over 6 months, if 22 and younger. The vaccine is not recommended for use in pregnant females. However, pregnancy testing is not needed before receiving a dose.  Measles, mumps, rubella (MMR) vaccine.** / You need at least 1 dose of MMR if you were born in 1957 or later. You may also need a 2nd dose. For females of childbearing age, rubella immunity should be determined. If there is no evidence of immunity, females who are not pregnant should be vaccinated. If there is no evidence of immunity, females who are pregnant should delay immunization until after pregnancy.  Pneumococcal 13-valent conjugate (PCV13) vaccine.** / Consult your health care provider.  Pneumococcal polysaccharide (PPSV23) vaccine.** / 1 to 2 doses if you smoke cigarettes or if you have certain conditions.  Meningococcal vaccine.** / 1 dose if you are age 20 to 10 years and a Market researcher living in a residence hall, or have one of several medical conditions, you need to get vaccinated against meningococcal disease. You may also need additional booster doses.  Hepatitis A vaccine.** / Consult your health care provider.  Hepatitis B vaccine.** / Consult your health care provider.  Haemophilus influenzae type b (Hib) vaccine.** / Consult your health care provider. Ages 78 to 64years  Blood pressure check.** / Every 1 to 2 years.  Lipid and cholesterol check.** / Every 5 years beginning at age 25 years.  Lung cancer screening. / Every year if you are aged 20 80 years and have a 30-pack-year history of smoking and currently smoke or have quit within the past 15 years. Yearly screening is stopped once you have quit smoking for at least 15 years or develop a health problem that would prevent you from having lung cancer treatment.  Clinical breast exam.** / Every year after age 73 years.  BRCA-related cancer risk  assessment.** / For women who have family members with a BRCA-related cancer (breast, ovarian, tubal, or peritoneal cancers).  Mammogram.** / Every year beginning at age 63 years and continuing for as long as you are in good health. Consult with your health care provider.  Pap test.** / Every 3 years starting at age 55 years through age 68 or 19 years with a history of 3 consecutive normal Pap tests.  HPV screening.** / Every 3 years from ages 3 years through ages 38 to 7 years with a history of 3 consecutive normal Pap tests.  Fecal occult blood test (FOBT) of stool. / Every year beginning at age 18 years and continuing until age 70 years. You may not need to do this test if you get a colonoscopy every 10 years.  Flexible sigmoidoscopy or colonoscopy.** / Every 5 years for a flexible sigmoidoscopy or every 10 years for a colonoscopy beginning at age 71 years and continuing until age 55 years.  Hepatitis C blood test.** / For all people born from [birth year] through 1966 and any individual with known risks for hepatitis C.  Skin self-exam. / Monthly.  Influenza vaccine. / Every year.  Tetanus, diphtheria, and acellular pertussis (Tdap/Td) vaccine.** / Consult your health care provider. Pregnant women should receive 1 dose of Tdap vaccine during each pregnancy. 1  dose of Td every 10 years.  Varicella vaccine.** / Consult your health care provider. Pregnant females who do not have evidence of immunity should receive the first dose after pregnancy.  Zoster vaccine.** / 1 dose for adults aged 17 years or older.  Measles, mumps, rubella (MMR) vaccine.** / You need at least 1 dose of MMR if you were born in 1957 or later. You may also need a 2nd dose. For females of childbearing age, rubella immunity should be determined. If there is no evidence of immunity, females who are not pregnant should be vaccinated. If there is no evidence of immunity, females who are pregnant should delay immunization until  after pregnancy.  Pneumococcal 13-valent conjugate (PCV13) vaccine.** / Consult your health care provider.  Pneumococcal polysaccharide (PPSV23) vaccine.** / 1 to 2 doses if you smoke cigarettes or if you have certain conditions.  Meningococcal vaccine.** / Consult your health care provider.  Hepatitis A vaccine.** / Consult your health care provider.  Hepatitis B vaccine.** / Consult your health care provider.  Haemophilus influenzae type b (Hib) vaccine.** / Consult your health care provider. Ages 63 years and over  Blood pressure check.** / Every 1 to 2 years.  Lipid and cholesterol check.** / Every 5 years beginning at age 30 years.  Lung cancer screening. / Every year if you are aged 70 80 years and have a 30-pack-year history of smoking and currently smoke or have quit within the past 15 years. Yearly screening is stopped once you have quit smoking for at least 15 years or develop a health problem that would prevent you from having lung cancer treatment.  Clinical breast exam.** / Every year after age 72 years.  BRCA-related cancer risk assessment.** / For women who have family members with a BRCA-related cancer (breast, ovarian, tubal, or peritoneal cancers).  Mammogram.** / Every year beginning at age 10 years and continuing for as long as you are in good health. Consult with your health care provider.  Pap test.** / Every 3 years starting at age 59 years through age 45 or 46 years with 3 consecutive normal Pap tests. Testing can be stopped between 65 and 70 years with 3 consecutive normal Pap tests and no abnormal Pap or HPV tests in the past 10 years.  HPV screening.** / Every 3 years from ages 50 years through ages 34 or 75 years with a history of 3 consecutive normal Pap tests. Testing can be stopped between 65 and 70 years with 3 consecutive normal Pap tests and no abnormal Pap or HPV tests in the past 10 years.  Fecal occult blood test (FOBT) of stool. / Every year  beginning at age 41 years and continuing until age 13 years. You may not need to do this test if you get a colonoscopy every 10 years.  Flexible sigmoidoscopy or colonoscopy.** / Every 5 years for a flexible sigmoidoscopy or every 10 years for a colonoscopy beginning at age 11 years and continuing until age 48 years.  Hepatitis C blood test.** / For all people born from [birth year] through 1966 and any individual with known risks for hepatitis C.  Osteoporosis screening.** / A one-time screening for women ages 37 years and over and women at risk for fractures or osteoporosis.  Skin self-exam. / Monthly.  Influenza vaccine. / Every year.  Tetanus, diphtheria, and acellular pertussis (Tdap/Td) vaccine.** / 1 dose of Td every 10 years.  Varicella vaccine.** / Consult your health care provider.  Zoster vaccine.** / 1  dose for adults aged 1 years or older.  Pneumococcal 13-valent conjugate (PCV13) vaccine.** / Consult your health care provider.  Pneumococcal polysaccharide (PPSV23) vaccine.** / 1 dose for all adults aged 110 years and older.  Meningococcal vaccine.** / Consult your health care provider.  Hepatitis A vaccine.** / Consult your health care provider.  Hepatitis B vaccine.** / Consult your health care provider.  Haemophilus influenzae type b (Hib) vaccine.** / Consult your health care provider. ** Family history and personal history of risk and conditions may change your health care provider's recommendations. Document Released: 01/22/2002 Document Revised: 09/16/2013 Document Reviewed: 04/23/2011 [facility] Patient Information 2014 [place], [place].

## 2014-05-19 NOTE — Progress Notes (Signed)
    GYNECOLOGY CLINIC ANNUAL PREVENTATIVE CARE ENCOUNTER NOTE  Subjective:     Courtney Molina is a 33 y.o. G0P0 female here for a routine annual gynecologic exam.  Current complaints: none. Was seen on 04/26/14 for irregular menses, ultrasound ordered.  See below for results. No AUB since that visit.   Gynecologic History Patient's last menstrual period was 04/20/2014. Contraception: none Last Pap: cannot remember. Results were: normal  Obstetric History OB History  Gravida Para Term Preterm AB SAB TAB Ectopic Multiple Living  0                 The following portions of the patient's history were reviewed and updated as appropriate: allergies, current medications, past family history, past medical history, past social history, past surgical history and problem list.  Review of Systems A comprehensive review of systems was negative.    Objective:   BP 103/61  Pulse 86  Ht 5\' 2"  (1.575 m)  Wt 299 lb 12.8 oz (135.988 kg)  BMI 54.82 kg/m2  LMP 04/20/2014 GENERAL: Well-developed, obese female in no acute distress.  HEENT: Normocephalic, atraumatic. Sclerae anicteric.  NECK: Supple. Normal thyroid.  LUNGS: Clear to auscultation bilaterally.  HEART: Regular rate and rhythm.  BREASTS: Large, symmetric in size. No masses, skin changes, nipple drainage, or lymphadenopathy.  ABDOMEN: Soft, obese, nontender, nondistended. No organomegaly palpated. PELVIC: Normal external female genitalia. Vagina is pink and rugated. Normal discharge. Normal cervix contour. Pap smear obtained. Unable to palpate uterus or adnexa secondary to habitus  EXTREMITIES: No cyanosis, clubbing, or edema, 2+ distal pulses.   04/26/2014   CLINICAL DATA:  History of fibroids.  EXAM: TRANSABDOMINAL AND TRANSVAGINAL ULTRASOUND OF PELVIS  TECHNIQUE: Both transabdominal and transvaginal ultrasound examinations of the pelvis were performed. Transabdominal technique was performed for global imaging of the pelvis  including uterus, ovaries, adnexal regions, and pelvic cul-de-sac. It was necessary to proceed with endovaginal exam following the transabdominal exam to visualize the uterus, and endometrium.  COMPARISON:  None  FINDINGS: Uterus  Measurements: 10.2 x 4.5 x 5.1 cm. This corresponds to a 110 cc uterus. No fibroids or other mass visualized.  Endometrium  Thickness: 6 mm.  Fluid identified within the cervical canal.  Right ovary  Measurements: 3.5 x 3.1 x 3.0 cm. Normal appearance/no adnexal mass.  Left ovary  Measurements: Not visualized. No adnexal mass.  Other findings  No free fluid.  IMPRESSION: 1. Normal appearance of the uterus status post myomectomy. The volume of the uterus is currently 110 cc. 2. Nonvisualization of the left ovary.   Electronically Signed   By: Kerby Moors M.D.   On: 04/26/2014 09:33    Assessment:   Annual gynecologic examination PCOS   Plan:   Pap, ancillary STI testing and health maintenance labs done, will follow up results and manage accordingly. Normal ultrasound. Megace as needed for AUB. Metformin prescribed for PCOS. Routine preventative health maintenance measures emphasized   Verita Schneiders, MD, [place] Attending [place], [place]

## 2014-05-20 LAB — COMPREHENSIVE METABOLIC PANEL
ALT: 14 U/L (ref 0–35)
AST: 6 U/L (ref 0–37)
Albumin: 3.6 g/dL (ref 3.5–5.2)
Alkaline Phosphatase: 67 U/L (ref 39–117)
BILIRUBIN TOTAL: 0.2 mg/dL (ref 0.2–1.2)
BUN: 9 mg/dL (ref 6–23)
CO2: 23 mEq/L (ref 19–32)
Calcium: 8.6 mg/dL (ref 8.4–10.5)
Chloride: 104 mEq/L (ref 96–112)
Creat: 0.72 mg/dL (ref 0.50–1.10)
Glucose, Bld: 152 mg/dL — ABNORMAL HIGH (ref 70–99)
Potassium: 4 mEq/L (ref 3.5–5.3)
SODIUM: 137 meq/L (ref 135–145)
TOTAL PROTEIN: 6.7 g/dL (ref 6.0–8.3)

## 2014-05-20 LAB — CBC
HCT: 39.1 % (ref 36.0–46.0)
Hemoglobin: 12.7 g/dL (ref 12.0–15.0)
MCH: 25 pg — AB (ref 26.0–34.0)
MCHC: 32.5 g/dL (ref 30.0–36.0)
MCV: 77.1 fL — ABNORMAL LOW (ref 78.0–100.0)
PLATELETS: 256 10*3/uL (ref 150–400)
RBC: 5.07 MIL/uL (ref 3.87–5.11)
RDW: 14.9 % (ref 11.5–15.5)
WBC: 6.5 10*3/uL (ref 4.0–10.5)

## 2014-05-20 LAB — HIV ANTIBODY (ROUTINE TESTING W REFLEX): HIV: NONREACTIVE

## 2014-05-20 LAB — TSH: TSH: 1.877 u[IU]/mL (ref 0.350–4.500)

## 2014-05-20 LAB — RPR

## 2014-05-20 LAB — HEMOGLOBIN A1C
Hgb A1c MFr Bld: 6.4 % — ABNORMAL HIGH (ref <5.7)
Mean Plasma Glucose: 137 mg/dL — ABNORMAL HIGH (ref <117)

## 2014-05-20 LAB — HEPATITIS C ANTIBODY: HCV Ab: NEGATIVE

## 2014-05-24 LAB — CYTOLOGY - PAP

## 2014-05-25 ENCOUNTER — Telehealth: Payer: Self-pay | Admitting: *Deleted

## 2014-05-25 DIAGNOSIS — E119 Type 2 diabetes mellitus without complications: Secondary | ICD-10-CM

## 2014-05-25 NOTE — Telephone Encounter (Signed)
Message copied by Erik Obey on Tue May 25, 2014  4:22 PM ------      Message from: Verita Schneiders A      Created: Mon May 24, 2014  8:59 AM       Increased risk of DM with HgA1C of 6.4( 6.5 is diagnostic of DM). Needs to be referred to PCP ([place]?). ------

## 2014-05-25 NOTE — Telephone Encounter (Signed)
Patient given test results and referral has been placed.  Patient wishes to call the office to make her own appointment.

## 2014-06-04 ENCOUNTER — Telehealth: Payer: Self-pay | Admitting: *Deleted

## 2014-06-04 DIAGNOSIS — D259 Leiomyoma of uterus, unspecified: Secondary | ICD-10-CM

## 2014-06-04 DIAGNOSIS — E282 Polycystic ovarian syndrome: Secondary | ICD-10-CM

## 2014-06-04 MED ORDER — NAPROXEN 500 MG PO TABS: 500.0000 mg | ORAL_TABLET | Freq: Two times a day (BID) | ORAL | Status: DC

## 2014-06-04 MED ORDER — MEGESTROL ACETATE 40 MG PO TABS: ORAL_TABLET | ORAL | Status: DC

## 2014-06-04 MED ORDER — METFORMIN HCL ER 750 MG PO TB24: 750.0000 mg | ORAL_TABLET | Freq: Two times a day (BID) | ORAL | Status: DC

## 2014-06-04 NOTE — Telephone Encounter (Signed)
Pt called and needed refills on her Megestrol, Metformin and Naproxen.  I have sent in refills.

## 2014-10-02 ENCOUNTER — Emergency Department (HOSPITAL_COMMUNITY)
Admission: EM | Admit: 2014-10-02 | Discharge: 2014-10-02 | Disposition: A | Discharge status: Home / Self Care | Payer: Managed Care, Other (non HMO) | Attending: Emergency Medicine | Admitting: Emergency Medicine

## 2014-10-02 ENCOUNTER — Encounter (HOSPITAL_COMMUNITY): Payer: Self-pay | Admitting: Emergency Medicine

## 2014-10-02 DIAGNOSIS — K088 Other specified disorders of teeth and supporting structures: Secondary | ICD-10-CM | POA: Diagnosis not present

## 2014-10-02 DIAGNOSIS — K029 Dental caries, unspecified: Secondary | ICD-10-CM | POA: Diagnosis not present

## 2014-10-02 DIAGNOSIS — E669 Obesity, unspecified: Secondary | ICD-10-CM | POA: Diagnosis not present

## 2014-10-02 DIAGNOSIS — E119 Type 2 diabetes mellitus without complications: Secondary | ICD-10-CM | POA: Diagnosis not present

## 2014-10-02 DIAGNOSIS — R51 Headache: Secondary | ICD-10-CM | POA: Diagnosis not present

## 2014-10-02 DIAGNOSIS — Z79899 Other long term (current) drug therapy: Secondary | ICD-10-CM | POA: Diagnosis not present

## 2014-10-02 DIAGNOSIS — K0889 Other specified disorders of teeth and supporting structures: Secondary | ICD-10-CM

## 2014-10-02 DIAGNOSIS — Z8742 Personal history of other diseases of the female genital tract: Secondary | ICD-10-CM | POA: Insufficient documentation

## 2014-10-02 MED ORDER — OXYCODONE-ACETAMINOPHEN 5-325 MG PO TABS: 1.0000 | ORAL_TABLET | Freq: Four times a day (QID) | ORAL | Status: DC | PRN

## 2014-10-02 MED ORDER — PENICILLIN V POTASSIUM 500 MG PO TABS: 500.0000 mg | ORAL_TABLET | Freq: Four times a day (QID) | ORAL | Status: AC

## 2014-10-02 MED ORDER — IBUPROFEN 600 MG PO TABS: 600.0000 mg | ORAL_TABLET | Freq: Four times a day (QID) | ORAL | Status: AC | PRN

## 2014-10-02 MED ORDER — IBUPROFEN 800 MG PO TABS
800.0000 mg | ORAL_TABLET | Freq: Once | ORAL | Status: AC
  Administered 2014-10-02: 800 mg via ORAL
  Filled 2014-10-02: qty 1

## 2014-10-02 MED ORDER — PENICILLIN V POTASSIUM 500 MG PO TABS
500.0000 mg | ORAL_TABLET | Freq: Once | ORAL | Status: AC
  Administered 2014-10-02: 500 mg via ORAL
  Filled 2014-10-02: qty 1

## 2014-10-02 NOTE — ED Provider Notes (Signed)
Medical screening examination/treatment/procedure(s) were performed by non-physician practitioner and as supervising physician I was immediately available for consultation/collaboration.   EKG Interpretation None        Everlene Balls, MD 10/02/14 1344

## 2014-10-02 NOTE — ED Notes (Signed)
Pt c/o upper and lower R sided dental pain x 1 day, worse today. Missing/broken tooth noted.

## 2014-10-02 NOTE — Discharge Instructions (Signed)
Dental Caries °Dental caries (also called tooth decay) is the most common oral disease. It can occur at any age but is more common in children and young adults.  °HOW DENTAL CARIES DEVELOPS  °The process of decay begins when bacteria and foods (particularly sugars and starches) combine in your mouth to produce plaque. Plaque is a substance that sticks to the hard, outer surface of a tooth (enamel). The bacteria in plaque produce acids that attack enamel. These acids may also attack the root surface of a tooth (cementum) if it is exposed. Repeated attacks dissolve these surfaces and create holes in the tooth (cavities). If left untreated, the acids destroy the other layers of the tooth.  °RISK FACTORS °· Frequent sipping of sugary beverages.   °· Frequent snacking on sugary and starchy foods, especially those that easily get stuck in the teeth.   °· Poor oral hygiene.   °· Dry mouth.   °· Substance abuse such as methamphetamine abuse.   °· Broken or poor-fitting dental restorations.   °· Eating disorders.   °· Gastroesophageal reflux disease (GERD).   °· Certain radiation treatments to the head and neck. °SYMPTOMS °In the early stages of dental caries, symptoms are seldom present. Sometimes white, chalky areas may be seen on the enamel or other tooth layers. In later stages, symptoms may include: °· Pits and holes on the enamel. °· Toothache after sweet, hot, or cold foods or drinks are consumed. °· Pain around the tooth. °· Swelling around the tooth. °DIAGNOSIS  °Most of the time, dental caries is detected during a regular dental checkup. A diagnosis is made after a thorough medical and dental history is taken and the surfaces of your teeth are checked for signs of dental caries. Sometimes special instruments, such as lasers, are used to check for dental caries. Dental X-ray exams may be taken so that areas not visible to the eye (such as between the contact areas of the teeth) can be checked for cavities.    °TREATMENT  °If dental caries is in its early stages, it may be reversed with a fluoride treatment or an application of a remineralizing agent at the dental office. Thorough brushing and flossing at home is needed to aid these treatments. If it is in its later stages, treatment depends on the location and extent of tooth destruction:  °· If a small area of the tooth has been destroyed, the destroyed area will be removed and cavities will be filled with a material such as gold, silver amalgam, or composite resin.   °· If a large area of the tooth has been destroyed, the destroyed area will be removed and a cap (crown) will be fitted over the remaining tooth structure.   °· If the center part of the tooth (pulp) is affected, a procedure called a root canal will be needed before a filling or crown can be placed.   °· If most of the tooth has been destroyed, the tooth may need to be pulled (extracted). °HOME CARE INSTRUCTIONS °You can prevent, stop, or reverse dental caries at home by practicing good oral hygiene. Good oral hygiene includes: °· Thoroughly cleaning your teeth at least twice a day with a toothbrush and dental floss.   °· Using a fluoride toothpaste. A fluoride mouth rinse may also be used if recommended by your dentist or health care provider.   °· Restricting the amount of sugary and starchy foods and sugary liquids you consume.   °· Avoiding frequent snacking on these foods and sipping of these liquids.   °· Keeping regular visits with   a dentist for checkups and cleanings. PREVENTION   Practice good oral hygiene.  Consider a dental sealant. A dental sealant is a coating material that is applied by your dentist to the pits and grooves of teeth. The sealant prevents food from being trapped in them. It may protect the teeth for several years.  Ask about fluoride supplements if you live in a community without fluorinated water or with water that has a low fluoride content. Use fluoride supplements  as directed by your dentist or health care provider.  Allow fluoride varnish applications to teeth if directed by your dentist or health care provider. Document Released: 08/18/2002 Document Revised: 04/12/2014 Document Reviewed: 11/28/2012 [facility] Patient Information 2015 [place], [place]. This information is not intended to replace advice given to you by your health care provider. Make sure you discuss any questions you have with your health care provider.  Dental Pain A tooth ache may be caused by cavities (tooth decay). Cavities expose the nerve of the tooth to air and hot or cold temperatures. It may come from an infection or abscess (also called a boil or furuncle) around your tooth. It is also often caused by dental caries (tooth decay). This causes the pain you are having. DIAGNOSIS  Your caregiver can diagnose this problem by exam. TREATMENT   If caused by an infection, it may be treated with medications which kill germs (antibiotics) and pain medications as prescribed by your caregiver. Take medications as directed.  Only take over-the-counter or prescription medicines for pain, discomfort, or fever as directed by your caregiver.  Whether the tooth ache today is caused by infection or dental disease, you should see your dentist as soon as possible for further care. SEEK MEDICAL CARE IF: The exam and treatment you received today has been provided on an emergency basis only. This is not a substitute for complete medical or dental care. If your problem worsens or new problems (symptoms) appear, and you are unable to meet with your dentist, call or return to this location. SEEK IMMEDIATE MEDICAL CARE IF:   You have a fever.  You develop redness and swelling of your face, jaw, or neck.  You are unable to open your mouth.  You have severe pain uncontrolled by pain medicine. MAKE SURE YOU:   Understand these instructions.  Will watch your condition.  Will get help right away if  you are not doing well or get worse. Document Released: 11/26/2005 Document Revised: 02/18/2012 Document Reviewed: 07/14/2008 [facility] Patient Information 2015 [place], [place]. This information is not intended to replace advice given to you by your health care provider. Make sure you discuss any questions you have with your health care provider.  Emergency Department Resource Guide 1) Find a Doctor and Pay Out of Pocket Although you won't have to find out who is covered by your insurance plan, it is a good idea to ask around and get recommendations. You will then need to call the office and see if the doctor you have chosen will accept you as a new patient and what types of options they offer for patients who are self-pay. Some doctors offer discounts or will set up payment plans for their patients who do not have insurance, but you will need to ask so you aren't surprised when you get to your appointment.  2) Contact Your Local Health Department Not all health departments have doctors that can see patients for sick visits, but many do, so it is worth a call to see  if yours does. If you don't know where your local health department is, you can check in your phone book. The CDC also has a tool to help you locate your state's health department, and many state websites also have listings of all of their local health departments.  3) Find a [facility] If your illness is not likely to be very severe or complicated, you may want to try a walk in clinic. These are popping up all over the country in pharmacies, drugstores, and shopping centers. They're usually staffed by nurse practitioners or physician assistants that have been trained to treat common illnesses and complaints. They're usually fairly quick and inexpensive. However, if you have serious medical issues or chronic medical problems, these are probably not your best option.  No Primary Care Doctor: - Call Health Connect at  [phone] - they can  help you locate a primary care doctor that  accepts your insurance, provides certain services, etc. - Physician Referral Service- [phone]  Chronic Pain Problems: Organization         Address  Phone   Notes  [facility]  [phone] Patients need to be referred by their primary care doctor.   Medication Assistance: Organization         Address  Phone   Notes  [facility] Medication [facility] [place] [address] [place], [place] [address] [phone] --Must be a resident of [facility] -- Must have NO insurance coverage whatsoever (no Medicaid/ Medicare, etc.) -- The pt. MUST have a primary care doctor that directs their care regularly and follows them in the community   MedAssist  [phone]   [employer]  [phone]    Agencies that provide inexpensive medical care: Organization         Address  Phone   Notes  [place]  [phone]   [facility]    [phone]   [facility] [place], [place] [address] [phone]   [place] [address] [place] [phone]   Planned Parenthood    [phone]   [facility]    [phone]   [place] and [place] [address], [place] Phone:  [phone], Fax:  [phone] Hours of Operation:  9 am - 6 pm, M-F.  Also accepts Medicaid/Medicare and self-pay.  [facility] for [place] [address] [place], [address], [place] Phone: [phone], Fax: [phone]. Hours of Operation:  8:30 am - 5:30 pm, M-F.  Also accepts Medicaid and self-pay.  [facility] [place] [address] [place] Phone: [phone]   [place], [place], [place] [phone], Ext. [phone] Mondays & Thursdays: 7-9 AM.  First 15 patients are seen on a first come, first serve basis.    [place] Providers:  Organization         Address  Phone   Notes  [facility] [address], [place] [phone] Also accepts self-pay patients.  [place], [place]  [phone]   [place], [address], [place] [phone]   [place] [address] [place] [phone]   Lucianne Lei [address], [place]   [phone] Only accepts [place] Access Medicaid patients after they have their name applied to their card.   Self-Pay (no insurance) in [facility]:  Organization         Address  Phone   Notes  Sickle Cell Patients, [facility] [place] [phone]   [facility] [place] [phone]   [facility] [place]  [place], [place], [place] [phone]   [facility]/Dr. Osei-Bonsu  [address], [place] or [address], [place] [phone] Phone number for both [place] and [place] locations is the same.  [facility] [address], [place] [phone]   [place] [address], [place] or [address] [phone]   [facility] [address] [place] [phone], phone; [phone], fax Sees patients 1st and 3rd Saturday of every month.  Must not qualify for public or private insurance (i.e. Medicaid, Medicare, [place] Health Choice, Veterans' Benefits)  Household income should be no more than 200% of the poverty level The clinic cannot treat you if you are pregnant or think you are pregnant  Sexually transmitted diseases are not treated at the clinic.    Dental Care: Organization         Address  Phone  Notes  [facility] Department of [facility] [place] [phone] Accepts children up to age 47 who are enrolled in [place] or [place]; pregnant women with a Medicaid card; and children who have applied for Medicaid or [place] Health Choice, but were declined, whose parents can pay a reduced fee at time of service.  [facility] Department of [facility]  [address], [place] [phone] Accepts children up to age 34 who are enrolled in [place] or [place]; pregnant women with a Medicaid card; and children who have applied for Medicaid or [place] Health Choice, but were declined, whose parents can pay a reduced fee at time of service.  [facility] Access PRO[facility]  [place] [phone] Patients are seen by appointment only. Walk-ins are not accepted. [place] will see patients 29 years of age and older. Monday - Tuesday (8am-5pm) Most Wednesdays (8:30-5pm) $30 per visit, cash only  [facility] Access PRO[facility]  [address], [facility] [phone] Patients are seen by appointment only. Walk-ins are not accepted. [place] will see patients 65 years of age and older. One Wednesday Evening (Monthly: Volunteer Based).  $30 per visit, cash only  [place]  [phone] for adults; Children under age 55, call Graduate Pediatric Dentistry at [phone]. Children aged 86-14, please call [phone] to request a pediatric application.  Dental services are provided in all areas of dental care including fillings, crowns and bridges, complete and partial dentures, implants, gum treatment, root canals, and extractions. Preventive care is also provided. Treatment is provided to both adults and children. Patients are selected via a lottery and there is often a waiting list.   [facility] [address] [place]  [phone] [email or web address], [place], [place] [phone], Ext. [phone] Second and Fourth Thursday of each month, opens at 6:30  AM; Clinic ends at 9 AM.  Patients are seen on a first-come first-served basis, and a limited number are seen during each clinic.   [facility]  [address] Hillard Danker [place], [place] [phone]   Eligibility Requirements You must have lived in [place], [place], or [place] counties for at least the last three months.   You cannot be eligible for state or federal sponsored [employer], including [employer], [place], or [employer].   You generally cannot be eligible for healthcare insurance through your employer.    How to apply: Eligibility screenings are held every Tuesday and Wednesday afternoon from 1:00 pm until 4:00 pm. You do not need an appointment for the interview!  [facility] [address] [place], [place]   [place]  [place] Department  [place]  [phone]    Behavioral Health Resources in the Community: Intensive Outpatient Programs Organization         Address  Phone  Notes  [place]. [address], [place], [place] [phone]   [facility] Outpatient [address] [place], [place]   ADS: [facility] Drug [facility] [address] [place], [place]   [place] [address],  [place], [place] or [phone]   Substance Abuse Resources Organization         Address  Phone  Notes  Alcohol and Drug Services  [phone]   [place]  [phone]   The [place]   Chinita Pester  [phone]   Residential & Outpatient Substance Abuse Program  [phone]   Psychological Services Organization         Address  Phone  Notes  [facility] [place]  [place]  [phone]   [place] [address], [place] or [phone]    Mobile Crisis Teams Organization         Address  Phone  Notes  [facility], Mobile Crisis Care Unit  [phone]   [facility]  [address] [place], [place]   [place] [address], [place] [phone]    Self-Help/Support Groups Organization         Address  Phone             Notes  [place]. of [place] - variety of support groups  [place] Call for more information  Narcotics Anonymous (NA), [facility] [address], [employer]   2 meetings at this location   Special educational needs teacher         Address  Phone  Notes  [facility]idential Treatment [place],    [place]  1-8074282019   [facility]  [address], [place] [phone], [place], [place]   [place], [place] [phone] Admissions: 8am-3pm M-F  Incentives Substance [place] [address]    [place], [place] [phone]   [facility] [address] Jadene Pierini [place], [place]   [facility] [address],  [place], [place]   [facility] Programs - Intensive Outpatient [address], [place], [place]   [facility] [place] ([place] [address] [place] [place], [place] or [phone]  [facility] (RTS) [address], [place], [place] Accepts Medicaid  [facility] [place] [address],  [place] [phone] Substance Abuse/Addiction Treatment   [facility] Organization         Address  Phone  Notes  [facility]  [phone]   Domenic Schwab, PhD [address] Arlis Porta [place], [place]   [phone] or  [phone]   [place], [place] [phone]   [place] [address], [place], [place] [phone] Insurance/Medicaid/sponsorship through [facility] and Families [address] [place]                                    [place], [place] [phone] [place] [address], [place] [phone]    Dr. Adele Schilder  [phone]   [facility] Dept. 1) [address] [place] 2) [place] 3)  [place], [place] [phone]  [phone]   [place] [phone] or [phone] (After Hours)

## 2014-10-02 NOTE — ED Provider Notes (Signed)
CSN: [id number]     Arrival date & time 10/02/14  0043 History   First MD Initiated Contact with Patient 10/02/14 0406     Chief Complaint  Patient presents with  . Dental Pain    (Consider location/radiation/quality/duration/timing/severity/associated sxs/prior Treatment) Patient is a 33 y.o. female presenting with tooth pain. The history is provided by the patient. No language interpreter was used.  Dental Pain Location:  Upper and lower Upper teeth location:  2/RU 2nd molar Lower teeth location:  30/RL 1st molar Quality:  Aching and throbbing Severity:  Moderate Onset quality:  Gradual Duration:  2 days Timing:  Constant Progression:  Waxing and waning Chronicity:  New Context: dental caries and poor dentition   Context: not trauma   Relieved by:  Nothing Worsened by:  Hot food/drink, cold food/drink, touching and pressure Ineffective treatments:  NSAIDs (Vicodin, clove oil, and peroxide) Associated symptoms: facial pain, gum swelling and headaches   Associated symptoms: no difficulty swallowing, no drooling, no facial swelling, no fever, no neck pain, no oral bleeding, no oral lesions and no trismus   Risk factors: diabetes and lack of dental care     Past Medical History  Diagnosis Date  . Diabetes mellitus   . Fibroids   . Obesity   . PCOS (polycystic ovarian syndrome)    Past Surgical History  Procedure Laterality Date  . Robot assisted myomectomy  09/12/2012    Procedure: ROBOTIC ASSISTED MYOMECTOMY;  Surgeon: Lahoma Crocker, MD;  Location: [facility];  Service: Gynecology;  Laterality: N/A;   No family history on file. History  Substance Use Topics  . Smoking status: Never Smoker   . Smokeless tobacco: Never Used  . Alcohol Use: Yes     Comment: socially    OB History   Grav Para Term Preterm Abortions TAB SAB Ect Mult Living   0               Review of Systems  Constitutional: Negative for fever.  HENT: Positive for dental problem. Negative for  drooling, facial swelling and mouth sores.   Musculoskeletal: Negative for neck pain.  Neurological: Positive for headaches.  All other systems reviewed and are negative.   Allergies  [place]  Home Medications   Prior to Admission medications   Medication Sig Start Date End Date Taking? Authorizing Provider  acetaminophen (TYLENOL) 500 MG tablet Take 1,000 mg by mouth every 6 (six) hours as needed for mild pain.   Yes Historical Provider, MD  megestrol (MEGACE) 40 MG tablet Take 80 mg by mouth daily.   Yes Historical Provider, MD  metFORMIN (GLUCOPHAGE-XR) 750 MG 24 hr tablet Take 1 tablet (750 mg total) by mouth 2 (two) times daily with a meal. 06/04/14  Yes Osborne Oman, MD  naproxen (NAPROSYN) 500 MG tablet Take 1 tablet (500 mg total) by mouth 2 (two) times daily with a meal. 06/04/14  Yes Ugonna A Anyanwu, MD  phentermine 37.5 MG capsule Take 37.5 mg by mouth every morning.   Yes Historical Provider, MD  ibuprofen (ADVIL,MOTRIN) 600 MG tablet Take 1 tablet (600 mg total) by mouth every 6 (six) hours as needed. 10/02/14   Antonietta Breach, PA-C  oxyCODONE-acetaminophen (PERCOCET/ROXICET) 5-325 MG per tablet Take 1-2 tablets by mouth every 6 (six) hours as needed for moderate pain or severe pain. 10/02/14   Antonietta Breach, PA-C  penicillin v potassium (VEETID) 500 MG tablet Take 1 tablet (500 mg total) by mouth 4 (four) times daily. 10/02/14 10/09/14  Antonietta Breach, PA-C   BP 125/80  Pulse 86  Temp(Src) 98.6 F (37 C) (Oral)  Resp 18  Ht 5\' 2"  (1.575 m)  Wt 302 lb (136.986 kg)  BMI 55.22 kg/m2  SpO2 98%  LMP 09/18/2014  Physical Exam  Nursing note and vitals reviewed. Constitutional: She is oriented to person, place, and time. She appears well-developed and well-nourished. No distress.  Nontoxic/nonseptic appearing  HENT:  Head: Normocephalic and atraumatic.  Mouth/Throat: Uvula is midline and oropharynx is clear and moist. No oral lesions. No trismus in the jaw. Abnormal  dentition. Dental caries present. No uvula swelling. No oropharyngeal exudate.    Mild gingival swelling around tender dentition without fluctuance. No trismus. Uvula midline.  Eyes: Conjunctivae and EOM are normal. No scleral icterus.  Neck: Normal range of motion.  Pulmonary/Chest: Effort normal. No respiratory distress.  Musculoskeletal: Normal range of motion.  Neurological: She is alert and oriented to person, place, and time.  Skin: Skin is warm and dry. No rash noted. She is not diaphoretic. No erythema. No pallor.  Psychiatric: She has a normal mood and affect. Her behavior is normal.    ED Course  Procedures (including critical care time) Labs Review Labs Reviewed - No data to display  Imaging Review No results found.   EKG Interpretation None      MDM   Final diagnoses:  [place] caries    Patient with toothache. No gross abscess. Exam unconcerning for Ludwig's angina or spread of infection. Will treat with penicillin and pain medicine. Urged patient to follow-up with dentist. Resource guide provided and patient agreeable to plan with no unaddressed concerns   Filed Vitals:   10/02/14 0220  BP: 125/80  Pulse: 86  Temp: 98.6 F (37 C)  TempSrc: Oral  Resp: 18  Height: 5\' 2"  (1.575 m)  Weight: 302 lb (136.986 kg)  SpO2: 98%       Antonietta Breach, PA-C 10/02/14 [phone]

## 2014-11-25 ENCOUNTER — Telehealth: Payer: Self-pay | Admitting: *Deleted

## 2014-11-25 DIAGNOSIS — E282 Polycystic ovarian syndrome: Secondary | ICD-10-CM

## 2014-11-25 MED ORDER — METFORMIN HCL ER 750 MG PO TB24: 750.0000 mg | ORAL_TABLET | Freq: Two times a day (BID) | ORAL | Status: DC

## 2014-11-25 NOTE — Telephone Encounter (Signed)
Patient needs refill of metformin for her PCOS.  She is completely out of medication.

## 2014-12-13 ENCOUNTER — Encounter: Payer: Self-pay | Admitting: *Deleted

## 2015-05-27 ENCOUNTER — Telehealth: Payer: Self-pay | Admitting: *Deleted

## 2015-05-27 DIAGNOSIS — E282 Polycystic ovarian syndrome: Secondary | ICD-10-CM

## 2015-05-27 MED ORDER — METFORMIN HCL ER 750 MG PO TB24: 750.0000 mg | ORAL_TABLET | Freq: Two times a day (BID) | ORAL | Status: DC

## 2015-05-27 NOTE — Telephone Encounter (Signed)
Pt needs refill of Metformin

## 2015-06-27 ENCOUNTER — Encounter: Payer: Self-pay | Admitting: Obstetrics and Gynecology

## 2015-06-27 ENCOUNTER — Ambulatory Visit (INDEPENDENT_AMBULATORY_CARE_PROVIDER_SITE_OTHER): Payer: Managed Care, Other (non HMO) | Admitting: Obstetrics and Gynecology

## 2015-06-27 VITALS — BP 115/79 | HR 88 | Resp 16 | Ht 62.0 in | Wt 301.0 lb

## 2015-06-27 DIAGNOSIS — Z3161 Procreative counseling and advice using natural family planning: Secondary | ICD-10-CM

## 2015-06-27 DIAGNOSIS — Z01419 Encounter for gynecological examination (general) (routine) without abnormal findings: Secondary | ICD-10-CM | POA: Diagnosis not present

## 2015-06-27 DIAGNOSIS — Z1151 Encounter for screening for human papillomavirus (HPV): Secondary | ICD-10-CM | POA: Diagnosis not present

## 2015-06-27 DIAGNOSIS — Z124 Encounter for screening for malignant neoplasm of cervix: Secondary | ICD-10-CM

## 2015-06-27 MED ORDER — METFORMIN HCL 1000 MG PO TABS: 1000.0000 mg | ORAL_TABLET | Freq: Two times a day (BID) | ORAL | Status: AC

## 2015-06-27 MED ORDER — MEDROXYPROGESTERONE ACETATE 10 MG PO TABS: ORAL_TABLET | ORAL | Status: AC

## 2015-06-27 NOTE — Progress Notes (Signed)
  Subjective:     Courtney Molina is a 34 y.o. female G0P0 with LMP 02/03/2015 and BMI 55 who is here for a comprehensive physical exam. The patient reports no problems. She is not currently sexually active but plans to be in the next month or 2 and is hoping to conceive. Patient suffered from PCOS and thus irregular cycles. Thus far she has had 2 cycles a year, heavy in flow lasting 10 days. Patient reports being seen by her PCP and had a normal HgA1c.  History   Social History  . Marital Status: Single    Spouse Name: N/A  . Number of Children: N/A  . Years of Education: N/A   Occupational History  . Not on file.   Social History Main Topics  . Smoking status: Never Smoker   . Smokeless tobacco: Never Used  . Alcohol Use: Yes     Comment: socially   . Drug Use: No  . Sexual Activity: Not Currently   Other Topics Concern  . Not on file   Social History Narrative  . No narrative on file   Health Maintenance  Topic Date Due  . PNEUMOCOCCAL POLYSACCHARIDE VACCINE (1) 12/27/1981  . FOOT EXAM  12/27/1989  . OPHTHALMOLOGY EXAM  12/27/1989  . URINE MICROALBUMIN  12/27/1989  . TETANUS/TDAP  12/27/1998  . HEMOGLOBIN A1C  11/18/2014  . INFLUENZA VACCINE  07/11/2015  . PAP SMEAR  05/19/2017  . HIV Screening  Completed   Past Medical History  Diagnosis Date  . Diabetes mellitus   . Fibroids   . Obesity   . PCOS (polycystic ovarian syndrome)    Past Surgical History  Procedure Laterality Date  . Robot assisted myomectomy  09/12/2012    Procedure: ROBOTIC ASSISTED MYOMECTOMY;  Surgeon: Lahoma Crocker, MD;  Location: [facility];  Service: Gynecology;  Laterality: N/A;   No family history on file.     Review of Systems Pertinent items are noted in HPI.   Objective:      GENERAL: Well-developed, well-nourished female in no acute distress. Obese HEENT: Normocephalic, atraumatic. Sclerae anicteric.  NECK: Supple. Normal thyroid.  LUNGS: Clear to auscultation  bilaterally.  HEART: Regular rate and rhythm. BREASTS: Symmetric in size. No palpable masses or lymphadenopathy, skin changes, or nipple drainage. ABDOMEN: Soft, nontender, nondistended. No organomegaly. obese PELVIC: Normal external female genitalia. Vagina is pink and rugated.  Normal discharge. Normal appearing cervix. Bimanual exam limited secondary to body habitus EXTREMITIES: No cyanosis, clubbing, or edema, 2+ distal pulses.    Assessment:    Healthy female exam.      Plan:    pap smear collected Patient advised to perform monthly self breast and vulva exams Patient will be contacted with any abnormal results Discussed weight loss management Referred to Nutritionist Discussed timing of intercourse with ovulation. Discussed the use of ovulation predictor kits Rx cyclic provera also provided RTC in 6 months if persistent amenorrhea See After Visit Summary for Counseling Recommendations

## 2015-06-28 LAB — CYTOLOGY - PAP

## 2015-11-29 ENCOUNTER — Telehealth: Payer: Self-pay | Admitting: *Deleted

## 2015-11-29 NOTE — Telephone Encounter (Signed)
Spoke to pt, states she is having a monthly menstrual cycle that is heavy at times so she uses the Megestrol, pt requesting refill on Naprosyn and Megestrol.  Pt has appt for follow-up on 12-13-14 to discuss starting Clomid.

## 2015-11-29 NOTE — Telephone Encounter (Signed)
-----   Message from Francia Greaves sent at 11/29/2015 11:47 AM EST ----- Regarding: Rx Request and Question Needs refill naproxen and megestrol  [facility] [address] [facility] [place]  Question about taking Provera and wants to try Clomid

## 2015-11-30 ENCOUNTER — Telehealth: Payer: Self-pay | Admitting: *Deleted

## 2015-11-30 DIAGNOSIS — D259 Leiomyoma of uterus, unspecified: Secondary | ICD-10-CM

## 2015-11-30 DIAGNOSIS — E282 Polycystic ovarian syndrome: Secondary | ICD-10-CM

## 2015-11-30 DIAGNOSIS — N920 Excessive and frequent menstruation with regular cycle: Secondary | ICD-10-CM

## 2015-11-30 MED ORDER — MEGESTROL ACETATE 40 MG PO TABS
80.0000 mg | ORAL_TABLET | Freq: Every day | ORAL | Status: AC
Start: 2015-11-30 — End: ?

## 2015-11-30 MED ORDER — NAPROXEN 500 MG PO TABS: 500.0000 mg | ORAL_TABLET | Freq: Two times a day (BID) | ORAL | Status: AC

## 2015-11-30 NOTE — Telephone Encounter (Signed)
Spoke to pt, sent rx to pharmacy and instructed on medication use.  Pt states she has been trying to conceive naturally since her last visit in July 2016.  Pt has appt in January to follow-up.

## 2015-11-30 NOTE — Telephone Encounter (Signed)
-----   Message from Mora Bellman, MD sent at 11/30/2015  8:34 AM EST ----- Ok to provide refill on Naprosyn and Megace. She really should try to conceive on her own and return after 6 months of trying if no success before we try clomid. She should continue with the Metformin which also helps with ovulation. Weight loss will also boost her fertility.  Peggy

## 2015-12-14 ENCOUNTER — Ambulatory Visit: Payer: Managed Care, Other (non HMO) | Admitting: Obstetrics and Gynecology

## 2015-12-26 ENCOUNTER — Ambulatory Visit (INDEPENDENT_AMBULATORY_CARE_PROVIDER_SITE_OTHER): Payer: Managed Care, Other (non HMO) | Admitting: Obstetrics and Gynecology

## 2015-12-26 ENCOUNTER — Encounter: Payer: Self-pay | Admitting: Obstetrics and Gynecology

## 2015-12-26 VITALS — BP 131/77 | HR 85 | Resp 18 | Ht 62.0 in | Wt 280.0 lb

## 2015-12-26 DIAGNOSIS — N970 Female infertility associated with anovulation: Secondary | ICD-10-CM | POA: Diagnosis not present

## 2015-12-26 MED ORDER — CLOMIPHENE CITRATE 50 MG PO TABS: ORAL_TABLET | ORAL | Status: AC

## 2015-12-26 NOTE — Progress Notes (Signed)
Patient ID: MAGGIEMAE DIGIOVANNA, female   DOB: August 03, 1981, 35 y.o.   MRN: [id number] 35 yo G0 here for initiation of clomid. Patient has a history of PCOS with irregular cycles. She reports regular cycles since starting provera. She has been trying to conceive without success for the past 6 months. She has not been using ovulation predictor kits to determine how often she ovulated during that time. Patient is ready for further interventions  Past Medical History  Diagnosis Date  . Diabetes mellitus   . Fibroids   . Obesity   . PCOS (polycystic ovarian syndrome)    Past Surgical History  Procedure Laterality Date  . Robot assisted myomectomy  09/12/2012    Procedure: ROBOTIC ASSISTED MYOMECTOMY;  Surgeon: Lahoma Crocker, MD;  Location: [facility];  Service: Gynecology;  Laterality: N/A;   Family History  Problem Relation Age of Onset  . Hypertension Mother   . Hypertension Father   . Diabetes Father    Social History  Substance Use Topics  . Smoking status: Never Smoker   . Smokeless tobacco: Never Used  . Alcohol Use: Yes     Comment: socially    ROS See pertinent in HPI  Blood pressure 131/77, pulse 85, resp. rate 18, height 5\' 2"  (1.575 m), weight 280 lb (127.007 kg), last menstrual period 12/02/2015.  GENERAL: Well-developed, well-nourished female in no acute distress. Obese Neuro: oriented x 3. Grossly intact  A/P 35 yo G0 here for initiation of clomid - Rx Clomid provided 50 mg - Continue weight loss efforts - Continue provera and metformin - Continue prenatal vitamins - RTC 3 months or prn

## 2016-04-28 IMAGING — US US TRANSVAGINAL NON-OB
1 series · 14 of 25 positions shown · non-contrast
Comparison: None

CLINICAL DATA: History of fibroids.



[Series 1: us pelvis complete · 14 of 64 slices shown]
[im 1/64]
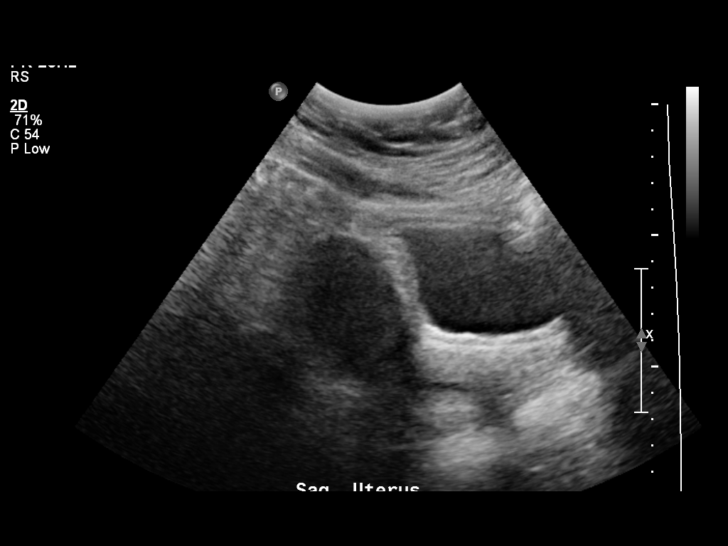
[im 6/64]
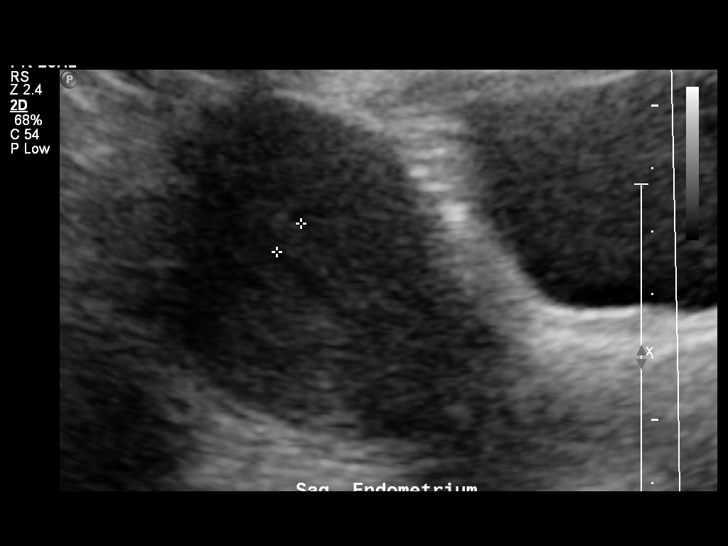
[im 11/64]
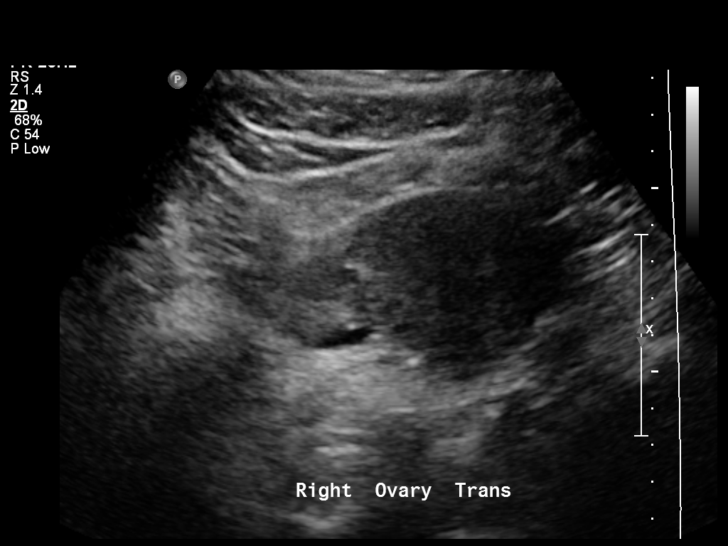
[im 16/64]
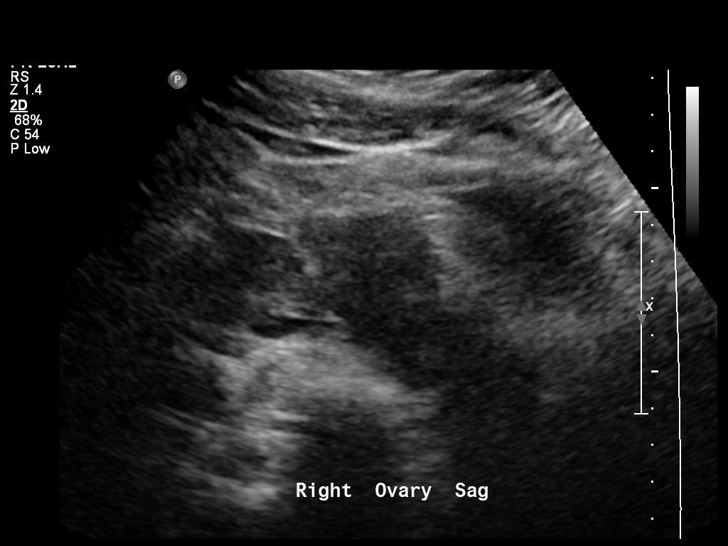
[im 22/64]
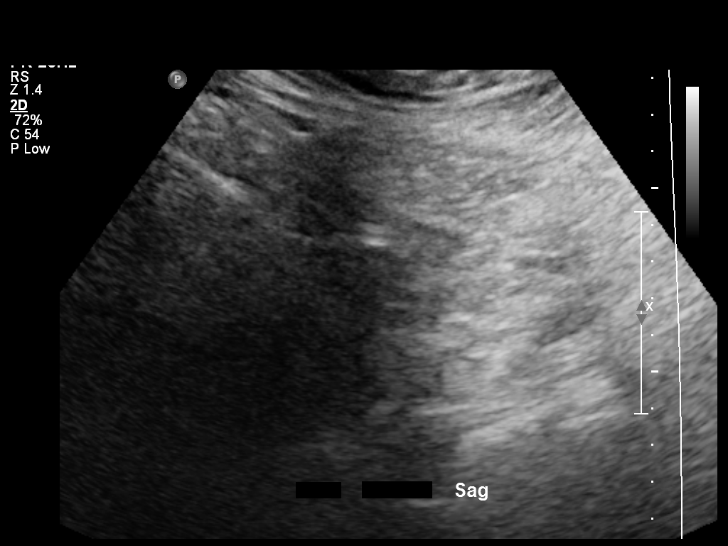
[im 24/64]
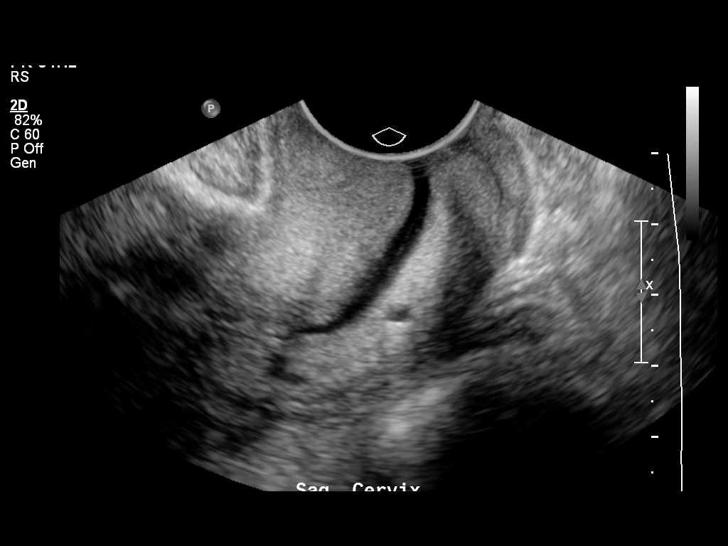
[im 29/64]
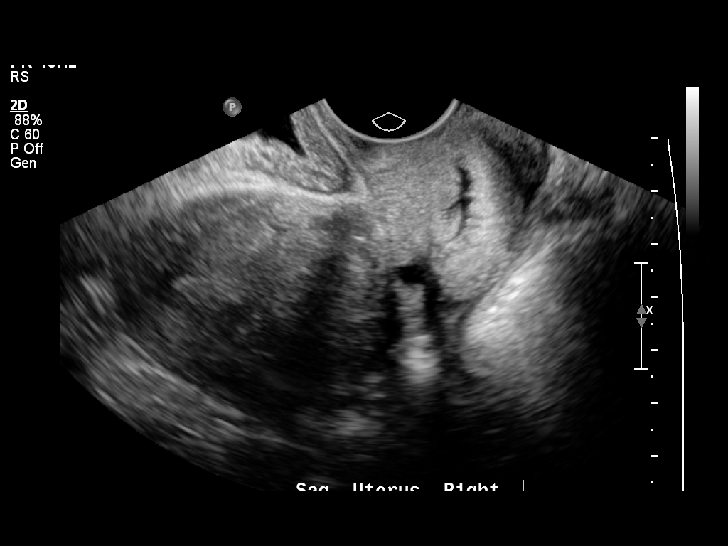
[im 35/64]
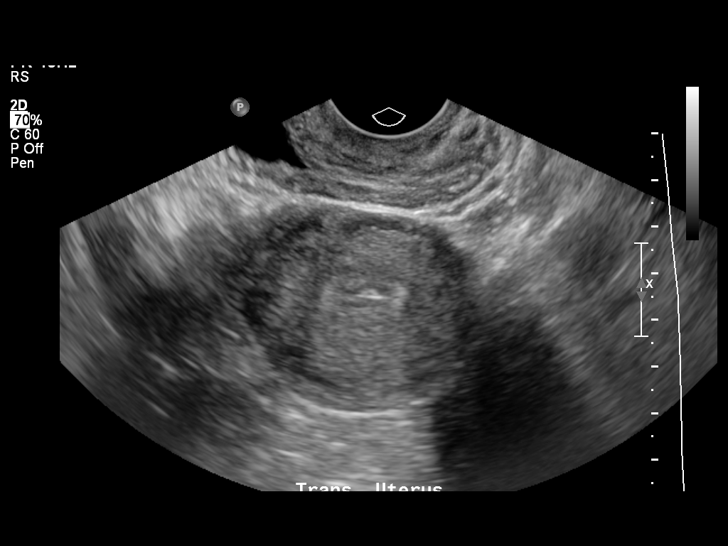
[im 40/64]
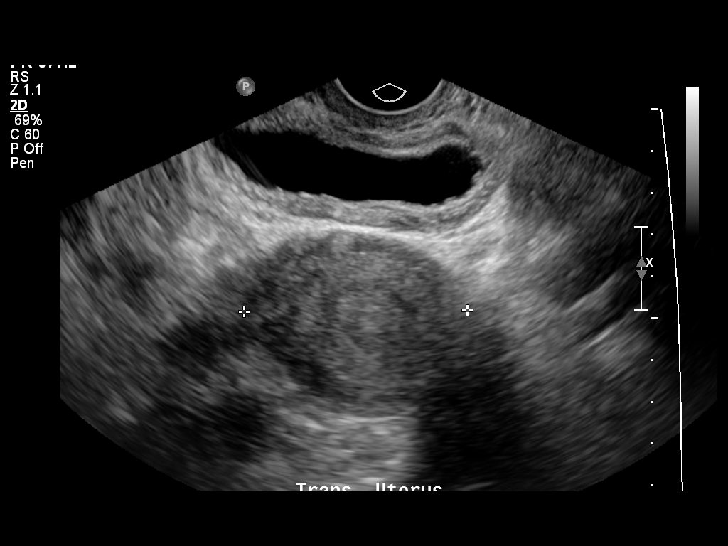
[im 43/64]
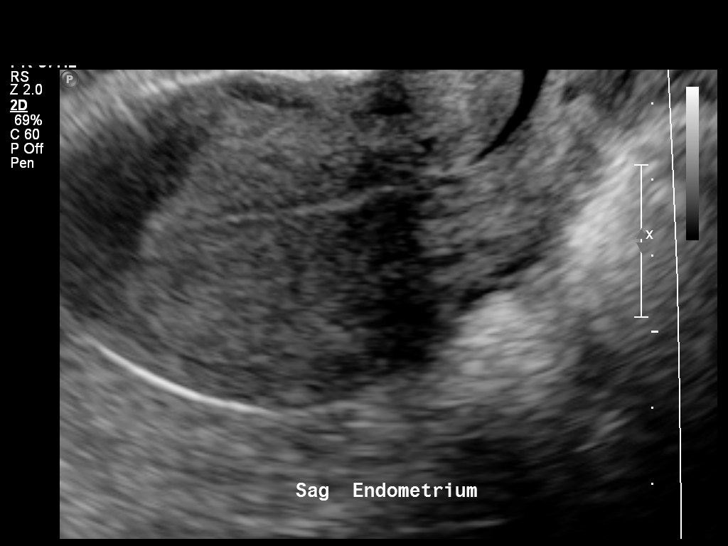
[im 48/64]
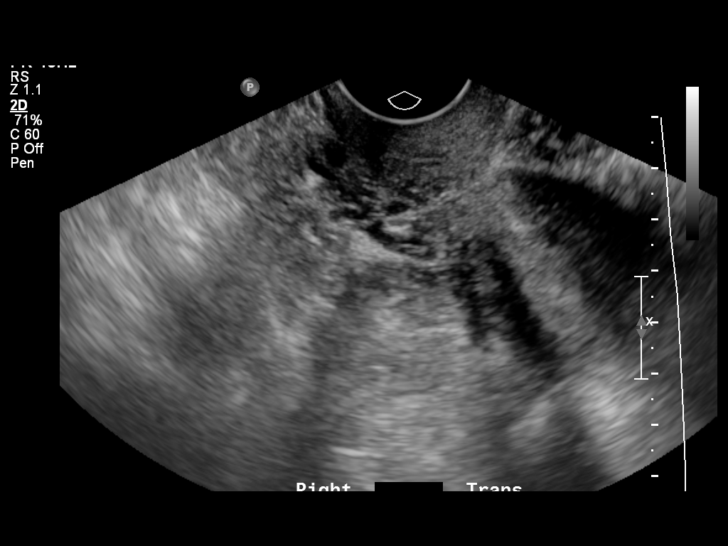
[im 53/64]
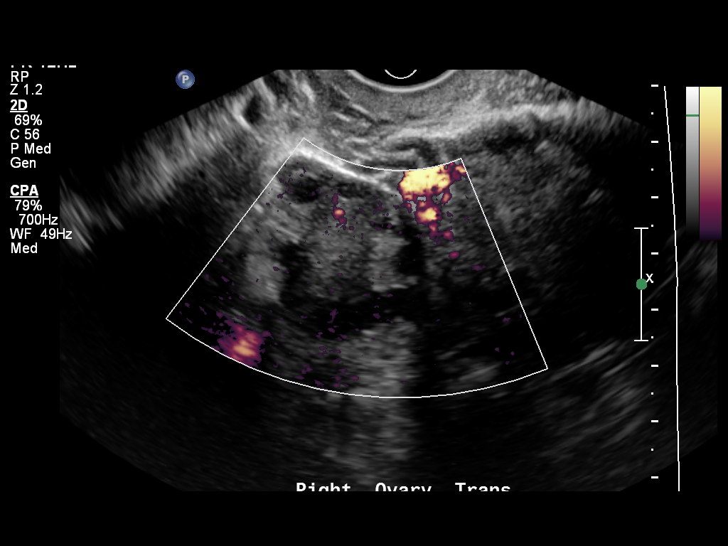
[im 58/64]
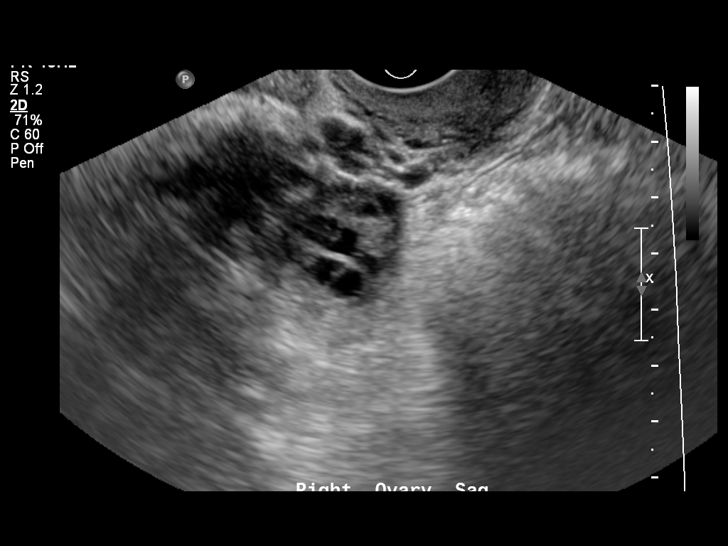
[im 64/64]
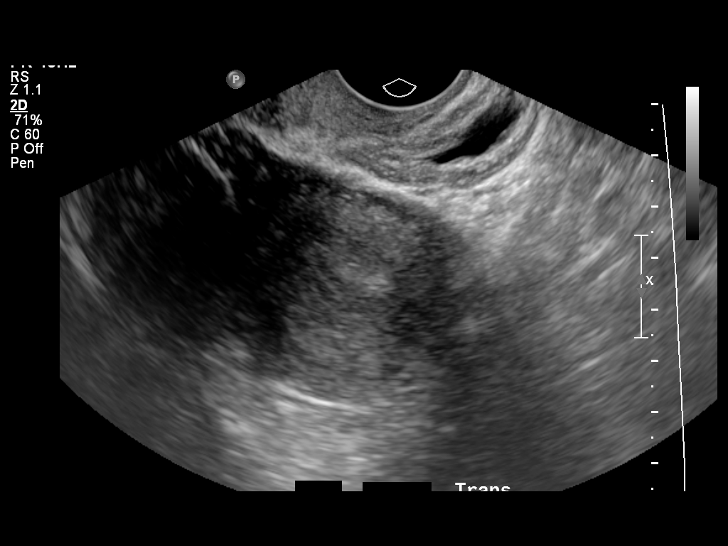

[14 of 25 positions shown; findings below may reference images not displayed]

FINDINGS: Uterus

Measurements: 10.2 x 4.5 x 5.1 cm. This corresponds to a 110 cc
uterus.. No fibroids or other mass visualized.

Endometrium

Thickness: 6 mm..  Fluid identified within the cervical canal.

Right ovary

Measurements: 3.5 x 3.1 x 3.0 cm.. Normal appearance/no adnexal
mass.

Left ovary

Measurements: Not visualized.. No adnexal mass.

Other findings

No free fluid.
IMPRESSION: 1. Normal appearance of the uterus status post myomectomy. The
volume of the uterus is currently 110 cc.
2. Nonvisualization of the left ovary.

## 2017-10-11 DIAGNOSIS — Z6841 Body Mass Index (BMI) 40.0 and over, adult: Secondary | ICD-10-CM | POA: Diagnosis not present

## 2017-10-11 DIAGNOSIS — R739 Hyperglycemia, unspecified: Secondary | ICD-10-CM | POA: Diagnosis not present

## 2017-10-11 DIAGNOSIS — Z23 Encounter for immunization: Secondary | ICD-10-CM | POA: Diagnosis not present

## 2017-11-22 DIAGNOSIS — Z6841 Body Mass Index (BMI) 40.0 and over, adult: Secondary | ICD-10-CM | POA: Diagnosis not present

## 2017-11-22 DIAGNOSIS — E282 Polycystic ovarian syndrome: Secondary | ICD-10-CM | POA: Diagnosis not present

## 2017-11-22 DIAGNOSIS — E118 Type 2 diabetes mellitus with unspecified complications: Secondary | ICD-10-CM | POA: Diagnosis not present

## 2018-09-29 DIAGNOSIS — N979 Female infertility, unspecified: Secondary | ICD-10-CM | POA: Diagnosis not present

## 2018-09-29 DIAGNOSIS — Z6841 Body Mass Index (BMI) 40.0 and over, adult: Secondary | ICD-10-CM | POA: Diagnosis not present

## 2018-09-29 DIAGNOSIS — E282 Polycystic ovarian syndrome: Secondary | ICD-10-CM | POA: Diagnosis not present

## 2018-09-29 DIAGNOSIS — E669 Obesity, unspecified: Secondary | ICD-10-CM | POA: Diagnosis not present

## 2018-10-03 DIAGNOSIS — E669 Obesity, unspecified: Secondary | ICD-10-CM | POA: Diagnosis not present

## 2018-10-03 DIAGNOSIS — N979 Female infertility, unspecified: Secondary | ICD-10-CM | POA: Diagnosis not present

## 2018-10-03 DIAGNOSIS — E282 Polycystic ovarian syndrome: Secondary | ICD-10-CM | POA: Diagnosis not present

## 2018-10-03 DIAGNOSIS — Z6841 Body Mass Index (BMI) 40.0 and over, adult: Secondary | ICD-10-CM | POA: Diagnosis not present

## 2018-10-14 DIAGNOSIS — R7303 Prediabetes: Secondary | ICD-10-CM | POA: Diagnosis not present

## 2018-10-14 DIAGNOSIS — Z6841 Body Mass Index (BMI) 40.0 and over, adult: Secondary | ICD-10-CM | POA: Diagnosis not present

## 2018-12-12 DIAGNOSIS — F439 Reaction to severe stress, unspecified: Secondary | ICD-10-CM | POA: Diagnosis not present

## 2019-01-02 DIAGNOSIS — F439 Reaction to severe stress, unspecified: Secondary | ICD-10-CM | POA: Diagnosis not present

## 2019-01-02 DIAGNOSIS — Z6841 Body Mass Index (BMI) 40.0 and over, adult: Secondary | ICD-10-CM | POA: Diagnosis not present

## 2019-03-06 DIAGNOSIS — E119 Type 2 diabetes mellitus without complications: Secondary | ICD-10-CM | POA: Diagnosis not present

## 2019-06-02 DIAGNOSIS — Z79899 Other long term (current) drug therapy: Secondary | ICD-10-CM | POA: Diagnosis not present

## 2019-06-02 DIAGNOSIS — R7989 Other specified abnormal findings of blood chemistry: Secondary | ICD-10-CM | POA: Diagnosis not present

## 2019-06-02 DIAGNOSIS — Z13220 Encounter for screening for lipoid disorders: Secondary | ICD-10-CM | POA: Diagnosis not present

## 2019-06-02 DIAGNOSIS — E119 Type 2 diabetes mellitus without complications: Secondary | ICD-10-CM | POA: Diagnosis not present

## 2019-06-08 DIAGNOSIS — Z7189 Other specified counseling: Secondary | ICD-10-CM | POA: Diagnosis not present

## 2019-06-08 DIAGNOSIS — E669 Obesity, unspecified: Secondary | ICD-10-CM | POA: Diagnosis not present

## 2019-06-08 DIAGNOSIS — Z6841 Body Mass Index (BMI) 40.0 and over, adult: Secondary | ICD-10-CM | POA: Diagnosis not present

## 2019-06-08 DIAGNOSIS — E119 Type 2 diabetes mellitus without complications: Secondary | ICD-10-CM | POA: Diagnosis not present

## 2019-06-15 DIAGNOSIS — N920 Excessive and frequent menstruation with regular cycle: Secondary | ICD-10-CM | POA: Diagnosis not present

## 2019-06-17 NOTE — H&P (Signed)
NAME: Courtney Molina, Courtney L. MEDICAL RECORD EX:[id number] ACCOUNT [id number] DATE OF BIRTH:12-15-1980 FACILITY: [facility] LOCATION:  PHYSICIAN:Rosalee Tolley Garry Heater, MD  HISTORY AND PHYSICAL  DATE OF ADMISSION:  07/02/2019  Date of surgery is 07/02/2019.    CHIEF COMPLAINT:  Abnormal uterine bleeding, endometrial polyp.  HISTORY OF PRESENT ILLNESS:  A 38 year old G29, P0.  I saw her originally 09/27/2018 with a history of PCOS.  She has also had a robotic myomectomy, removal of A 6 cm fibroid in the past.  Due to her obesity,  she is currently on metformin is also on  phentermine in an effort to try to help with weight loss.  Her 09/27/2018 labs showed thyroid normal.  Her total cholesterol 162, LDL was 105, FBG was 102 and her A1c was 6.1.  More recently, she has had some irregular bleeding, requiring OCPs b.i.d., now daily just to decrease the bleeding.  She underwent sonohysterogram in our office in July that showed the 5 mm endometrial polyp.  Adnexa negative.  There may have been one  very small fibroid, but this was minimal.  She presents at this time for D and C, hysteroscopy with MyoSure resection of an endometrial polyp.  This procedure including risks related to bleeding, infection, uterine perforation that may require additional  surgery.  All discussed with her, which she understands and accepts.  ALLERGIES:  None.  PAST SURGICAL HISTORY:  Robotic myomectomy in the past.  CURRENT MEDICATIONS:  Metformin ER, phentermine, Tri-Lo-Marzia (OCP).  SOCIAL HISTORY:  Denies tobacco or drug use.  She is single.  Minimal alcohol consumption.  PHYSICAL EXAMINATION: VITAL SIGNS:  Blood pressure 110/72, weight is 289, BMI 52.6. HEENT:  Unremarkable. NECK:  Supple, without masses. LUNGS:  Clear. CARDIOVASCULAR:  Regular rate and rhythm without murmurs, rubs or gallops. BREASTS:  Without masses. ABDOMEN:  Soft, flat, nontender. PELVIC:  Vulva, vagina, cervix normal.  Bimanual negative.  Exam  difficult due to her weight.  IMPRESSION:  Abnormal uterine bleeding, endometrial polyp.  PLAN:  D and C, hysteroscopy with NovaSure.  Procedure and risks reviewed as above.  AN/NUANCE  D:06/17/2019 T:06/17/2019 JOB:[id number]

## 2019-06-29 ENCOUNTER — Other Ambulatory Visit (HOSPITAL_COMMUNITY): Payer: Managed Care, Other (non HMO)

## 2019-06-29 ENCOUNTER — Inpatient Hospital Stay (HOSPITAL_COMMUNITY): Admission: RE | Admit: 2019-06-29 | Payer: Managed Care, Other (non HMO) | Source: Ambulatory Visit

## 2019-07-09 DIAGNOSIS — E669 Obesity, unspecified: Secondary | ICD-10-CM | POA: Diagnosis not present

## 2019-07-09 DIAGNOSIS — Z7189 Other specified counseling: Secondary | ICD-10-CM | POA: Diagnosis not present

## 2019-07-30 ENCOUNTER — Other Ambulatory Visit (HOSPITAL_COMMUNITY): Payer: Self-pay

## 2019-07-30 ENCOUNTER — Inpatient Hospital Stay (HOSPITAL_COMMUNITY): Admission: RE | Admit: 2019-07-30 | Payer: Self-pay | Source: Ambulatory Visit

## 2019-08-26 ENCOUNTER — Encounter (HOSPITAL_COMMUNITY): Payer: Self-pay

## 2019-08-26 NOTE — Progress Notes (Signed)
[id number] [place], [place] [address] Phone: [phone] Fax: [phone]  [id number] [place], [place] [id number] [place] [id number] Phone: [phone] Fax: [phone]  [place], [place] [place] [place] [address] Phone: [phone] Fax: [phone]     Your procedure is scheduled on August 31, 2019.  Report to [facility] Main Entrance "A" at 05:30 A.M., and check in at the Admitting office.  Call this number if you have problems the morning of surgery:  [phone]  Call [phone] if you have any questions prior to your surgery date Monday-Friday 8am-4pm   Remember:  Do not eat after midnight the night before your surgery  You may drink clear liquids until 04:15am the morning of your surgery.   Clear liquids allowed are: Water, Non-Citrus Juices (without pulp), Carbonated Beverages, Clear Tea, Black Coffee Only, and Gatorade    Take these medicines the morning of surgery with A SIP OF WATER : Norgestimate-Ethinyl Estradiol Triphasic (Tri-Lo-Marzia)   7 days prior to surgery STOP taking any Aspirin (unless otherwise instructed by your surgeon), Phentermine (Adipex-P), Aleve, Naproxen, Ibuprofen, Motrin, Advil, Goody's, BC's, all herbal medications, fish oil, and all vitamins.   WHAT DO I DO ABOUT MY DIABETES MEDICATION?   Marland Kitchen Do not take oral diabetes medicines (pills) the morning of surgery. DO NOT TAKE Metformin (Glucophage-XR) the morning of surgery.   How to Manage Your Diabetes Before and After Surgery  Why is it important to control my blood sugar before and after surgery? . Improving blood sugar levels before and after surgery helps healing and can limit problems. . A way of improving blood sugar control is eating a healthy diet by: o  Eating less sugar  and carbohydrates o  Increasing activity/exercise o  Talking with your doctor about reaching your blood sugar goals . High blood sugars (greater than 180 mg/dL) can raise your risk of infections and slow your recovery, so you will need to focus on controlling your diabetes during the weeks before surgery. . Make sure that the doctor who takes care of your diabetes knows about your planned surgery including the date and location.  How do I manage my blood sugar before surgery? . Check your blood sugar at least 4 times a day, starting 2 days before surgery, to make sure that the level is not too high or low. o Check your blood sugar the morning of your surgery when you wake up and every 2 hours until you get to the Short Stay unit. . If your blood sugar is less than 70 mg/dL, you will need to treat for low blood sugar: o Do not take insulin. o Treat a low blood sugar (less than 70 mg/dL) with  cup of clear juice (cranberry or apple), 4 glucose tablets, OR glucose gel. o Recheck blood sugar in 15 minutes after treatment (to make sure it is greater than 70 mg/dL). If your blood sugar is not greater than 70 mg/dL on recheck, call [phone] for further instructions. . Report your blood sugar to the short stay nurse when you get to Short Stay.  . If you are admitted to the hospital after surgery: o Your blood sugar will be checked by the staff and you will probably be given insulin after surgery (instead of oral diabetes medicines) to make sure you  have good blood sugar levels. o The goal for blood sugar control after surgery is 80-180 mg/dL.    The Morning of Surgery  Do not wear jewelry, make-up or nail polish.  Do not wear lotions, powders, or perfumes/colognes, or deodorant  Do not shave 48 hours prior to surgery.  Do not bring valuables to the hospital.  [facility] is not responsible for any belongings or valuables.  If you are a smoker, DO NOT Smoke 24 hours prior to surgery IF you  wear a CPAP at night please bring your mask, tubing, and machine the morning of surgery   Remember that you must have someone to transport you home after your surgery, and remain with you for 24 hours if you are discharged the same day.   Contacts, glasses, hearing aids, dentures or bridgework may not be worn into surgery.    Leave your suitcase in the car.  After surgery it may be brought to your room.  For patients admitted to the hospital, discharge time will be determined by your treatment team.  Patients discharged the day of surgery will not be allowed to drive home.    Special instructions:   [facility]- Preparing For Surgery  Before surgery, you can play an important role. Because skin is not sterile, your skin needs to be as free of germs as possible. You can reduce the number of germs on your skin by washing with CHG (chlorahexidine gluconate) Soap before surgery.  CHG is an antiseptic cleaner which kills germs and bonds with the skin to continue killing germs even after washing.    Oral Hygiene is also important to reduce your risk of infection.  Remember - BRUSH YOUR TEETH THE MORNING OF SURGERY WITH YOUR REGULAR TOOTHPASTE  Please do not use if you have an allergy to CHG or antibacterial soaps. If your skin becomes reddened/irritated stop using the CHG.  Do not shave (including legs and underarms) for at least 48 hours prior to first CHG shower. It is OK to shave your face.  Please follow these instructions carefully.   1. Shower the NIGHT BEFORE SURGERY and the MORNING OF SURGERY with CHG Soap.   2. If you chose to wash your hair, wash your hair first as usual with your normal shampoo.  3. After you shampoo, rinse your hair and body thoroughly to remove the shampoo.  4. Use CHG as you would any other liquid soap. You can apply CHG directly to the skin and wash gently with a scrungie or a clean washcloth.   5. Apply the CHG Soap to your body ONLY FROM THE NECK DOWN.   Do not use on open wounds or open sores. Avoid contact with your eyes, ears, mouth and genitals (private parts). Wash Face and genitals (private parts)  with your normal soap.   6. Wash thoroughly, paying special attention to the area where your surgery will be performed.  7. Thoroughly rinse your body with warm water from the neck down.  8. DO NOT shower/wash with your normal soap after using and rinsing off the CHG Soap.  9. Pat yourself dry with a CLEAN TOWEL.  10. Wear CLEAN PAJAMAS to bed the night before surgery, wear comfortable clothes the morning of surgery  11. Place CLEAN SHEETS on your bed the night of your first shower and DO NOT SLEEP WITH PETS.    Day of Surgery:  Do not apply any deodorants/lotions. Please shower the morning of surgery with the CHG  soap  Please wear clean clothes to the hospital/surgery center.   Remember to brush your teeth WITH YOUR REGULAR TOOTHPASTE.   Please read over the following fact sheets that you were given.

## 2019-08-27 ENCOUNTER — Encounter (HOSPITAL_COMMUNITY)
Admission: RE | Admit: 2019-08-27 | Discharge: 2019-08-27 | Disposition: A | Discharge status: Home / Self Care | Payer: BC Managed Care – PPO | Source: Ambulatory Visit | Attending: Obstetrics and Gynecology | Admitting: Obstetrics and Gynecology

## 2019-08-27 ENCOUNTER — Other Ambulatory Visit: Payer: Self-pay

## 2019-08-27 ENCOUNTER — Encounter (HOSPITAL_COMMUNITY): Payer: Self-pay

## 2019-08-27 ENCOUNTER — Other Ambulatory Visit (HOSPITAL_COMMUNITY)
Admission: RE | Admit: 2019-08-27 | Discharge: 2019-08-27 | Disposition: A | Discharge status: Home / Self Care | Payer: BC Managed Care – PPO | Source: Ambulatory Visit | Attending: Obstetrics and Gynecology | Admitting: Obstetrics and Gynecology

## 2019-08-27 DIAGNOSIS — Z20828 Contact with and (suspected) exposure to other viral communicable diseases: Secondary | ICD-10-CM | POA: Diagnosis not present

## 2019-08-27 DIAGNOSIS — N840 Polyp of corpus uteri: Secondary | ICD-10-CM | POA: Diagnosis not present

## 2019-08-27 DIAGNOSIS — Z01812 Encounter for preprocedural laboratory examination: Secondary | ICD-10-CM | POA: Diagnosis not present

## 2019-08-27 DIAGNOSIS — N939 Abnormal uterine and vaginal bleeding, unspecified: Secondary | ICD-10-CM | POA: Diagnosis not present

## 2019-08-27 HISTORY — DX: Anxiety disorder, unspecified: F41.9

## 2019-08-27 LAB — BASIC METABOLIC PANEL
Anion gap: 9 (ref 5–15)
BUN: 10 mg/dL (ref 6–20)
CO2: 27 mmol/L (ref 22–32)
Calcium: 9.3 mg/dL (ref 8.9–10.3)
Chloride: 101 mmol/L (ref 98–111)
Creatinine, Ser: 0.64 mg/dL (ref 0.44–1.00)
GFR calc Af Amer: >60 mL/min (ref >60)
GFR calc non Af Amer: >60 mL/min (ref >60)
Glucose, Bld: 121 mg/dL — ABNORMAL HIGH (ref 70–99)
Potassium: 3.9 mmol/L (ref 3.5–5.1)
Sodium: 137 mmol/L (ref 135–145)

## 2019-08-27 LAB — CBC
HCT: 43.7 % (ref 36.0–46.0)
Hemoglobin: 14.3 g/dL (ref 12.0–15.0)
MCH: 28.2 pg (ref 26.0–34.0)
MCHC: 32.7 g/dL (ref 30.0–36.0)
MCV: 86.2 fL (ref 80.0–100.0)
Platelets: 281 10*3/uL (ref 150–400)
RBC: 5.07 MIL/uL (ref 3.87–5.11)
RDW: 12.6 % (ref 11.5–15.5)
WBC: 8.5 10*3/uL (ref 4.0–10.5)
nRBC: 0 % (ref 0.0–0.2)

## 2019-08-27 LAB — TYPE AND SCREEN
ABO/RH(D): B NEG
Antibody Screen: NEGATIVE

## 2019-08-27 LAB — GLUCOSE, CAPILLARY: Glucose-Capillary: 131 mg/dL — ABNORMAL HIGH (ref 70–99)

## 2019-08-27 LAB — ABO/RH: ABO/RH(D): B NEG

## 2019-08-27 NOTE — Progress Notes (Signed)
PCP - Janie Morning, DO Cardiologist - denies  Chest x-ray - N/A EKG - N/A Stress Test - denies ECHO - denies Cardiac Cath - denies  Sleep Study - denies; neck is 17.5" CPAP - N/A  Fasting Blood Sugar - unknown Checks Blood Sugar - NEVER  Patient stopped ALL of her medications approx one month ago.  Patient states this procedure was cancelled before d/t not being told to hold her Phentermine so she decided to hold ALL of her medications this time.  ERAS Protcol -YES; told to drink clear liquids until 0415  COVID TEST- scheduled today following PAT visit, 08/27/2019  Coronavirus Screening  Have you experienced the following symptoms:  Cough yes/no: No Fever (>100.8F)  yes/no: No Runny nose yes/no: No Sore throat yes/no: No Difficulty breathing/shortness of breath  yes/no: No  Have you or a family member traveled in the last 14 days and where? yes/no: No  If the patient indicates "YES" to the above questions, their PAT will be rescheduled to limit the exposure to others and, the surgeon will be notified. THE PATIENT WILL NEED TO BE ASYMPTOMATIC FOR 14 DAYS.   If the patient is not experiencing any of these symptoms, the PAT nurse will instruct them to NOT bring anyone with them to their appointment since they may have these symptoms or traveled as well.   Please remind your patients and families that hospital visitation restrictions are in effect and the importance of the restrictions.   Anesthesia review: NO  Patient denies shortness of breath, fever, cough and chest pain at PAT appointment   Patient verbalized understanding of instructions that were given to them at the PAT appointment. Patient was also instructed that they will need to review over the PAT instructions again at home before surgery.

## 2019-08-28 LAB — HEMOGLOBIN A1C
Hgb A1c MFr Bld: 6.3 % — ABNORMAL HIGH (ref 4.8–5.6)
Mean Plasma Glucose: 134 mg/dL

## 2019-08-28 LAB — NOVEL CORONAVIRUS, NAA (HOSP ORDER, SEND-OUT TO REF LAB; TAT 18-24 HRS): SARS-CoV-2, NAA: NOT DETECTED

## 2019-08-31 ENCOUNTER — Other Ambulatory Visit: Payer: Self-pay

## 2019-08-31 ENCOUNTER — Observation Stay (HOSPITAL_COMMUNITY): Payer: BC Managed Care – PPO | Admitting: Anesthesiology

## 2019-08-31 ENCOUNTER — Observation Stay (HOSPITAL_COMMUNITY)
Admission: RE | Admit: 2019-08-31 | Discharge: 2019-08-31 | Disposition: A | Discharge status: Home / Self Care | Payer: BC Managed Care – PPO | Attending: Obstetrics and Gynecology | Admitting: Obstetrics and Gynecology

## 2019-08-31 ENCOUNTER — Encounter (HOSPITAL_COMMUNITY)
Admission: RE | Disposition: A | Discharge status: Home / Self Care | Payer: Self-pay | Source: Home / Self Care | Attending: Obstetrics and Gynecology

## 2019-08-31 ENCOUNTER — Encounter (HOSPITAL_COMMUNITY): Payer: Self-pay | Admitting: General Practice

## 2019-08-31 DIAGNOSIS — Z7984 Long term (current) use of oral hypoglycemic drugs: Secondary | ICD-10-CM | POA: Diagnosis not present

## 2019-08-31 DIAGNOSIS — Z79899 Other long term (current) drug therapy: Secondary | ICD-10-CM | POA: Insufficient documentation

## 2019-08-31 DIAGNOSIS — N939 Abnormal uterine and vaginal bleeding, unspecified: Secondary | ICD-10-CM | POA: Diagnosis not present

## 2019-08-31 DIAGNOSIS — N840 Polyp of corpus uteri: Secondary | ICD-10-CM | POA: Insufficient documentation

## 2019-08-31 DIAGNOSIS — E119 Type 2 diabetes mellitus without complications: Secondary | ICD-10-CM | POA: Insufficient documentation

## 2019-08-31 DIAGNOSIS — Z6841 Body Mass Index (BMI) 40.0 and over, adult: Secondary | ICD-10-CM | POA: Insufficient documentation

## 2019-08-31 DIAGNOSIS — Z793 Long term (current) use of hormonal contraceptives: Secondary | ICD-10-CM | POA: Diagnosis not present

## 2019-08-31 DIAGNOSIS — F419 Anxiety disorder, unspecified: Secondary | ICD-10-CM | POA: Diagnosis not present

## 2019-08-31 HISTORY — PX: DILATATION & CURETTAGE/HYSTEROSCOPY WITH MYOSURE: SHX6511

## 2019-08-31 LAB — GLUCOSE, CAPILLARY: Glucose-Capillary: 125 mg/dL — ABNORMAL HIGH (ref 70–99)

## 2019-08-31 LAB — POCT PREGNANCY, URINE: Preg Test, Ur: NEGATIVE

## 2019-08-31 SURGERY — DILATATION & CURETTAGE/HYSTEROSCOPY WITH MYOSURE
Anesthesia: General | Site: Vagina

## 2019-08-31 MED ORDER — FENTANYL CITRATE (PF) 100 MCG/2ML IJ SOLN: 25.0000 ug | INTRAMUSCULAR | Status: DC | PRN

## 2019-08-31 MED ORDER — OXYCODONE HCL 5 MG PO TABS
5.0000 mg | ORAL_TABLET | Freq: Once | ORAL | Status: AC | PRN
  Administered 2019-08-31: 09:00:00 5 mg via ORAL

## 2019-08-31 MED ORDER — ACETAMINOPHEN 500 MG PO TABS: 1000.0000 mg | ORAL_TABLET | Freq: Once | ORAL | Status: DC | PRN

## 2019-08-31 MED ORDER — ONDANSETRON HCL 4 MG/2ML IJ SOLN
INTRAMUSCULAR | Status: DC | PRN
  Administered 2019-08-31: 4 mg via INTRAVENOUS

## 2019-08-31 MED ORDER — ROCURONIUM BROMIDE 10 MG/ML (PF) SYRINGE
PREFILLED_SYRINGE | INTRAVENOUS | Status: AC
  Filled 2019-08-31: qty 10

## 2019-08-31 MED ORDER — LIDOCAINE HCL 1 % IJ SOLN
INTRAMUSCULAR | Status: AC
  Filled 2019-08-31: qty 20

## 2019-08-31 MED ORDER — DEXAMETHASONE SODIUM PHOSPHATE 10 MG/ML IJ SOLN
INTRAMUSCULAR | Status: DC | PRN
  Administered 2019-08-31: 10 mg via INTRAVENOUS

## 2019-08-31 MED ORDER — LIDOCAINE 2% (20 MG/ML) 5 ML SYRINGE
INTRAMUSCULAR | Status: AC
  Filled 2019-08-31: qty 5

## 2019-08-31 MED ORDER — SODIUM CHLORIDE 0.9 % IR SOLN
Status: DC | PRN
  Administered 2019-08-31: 1000 mL

## 2019-08-31 MED ORDER — OXYCODONE HCL 5 MG/5ML PO SOLN: 5.0000 mg | Freq: Once | ORAL | Status: AC | PRN

## 2019-08-31 MED ORDER — OXYCODONE HCL 5 MG PO TABS
ORAL_TABLET | ORAL | Status: AC
  Filled 2019-08-31: qty 1

## 2019-08-31 MED ORDER — LIDOCAINE HCL 1 % IJ SOLN
INTRAMUSCULAR | Status: DC | PRN
  Administered 2019-08-31: 5 mL

## 2019-08-31 MED ORDER — ACETAMINOPHEN 10 MG/ML IV SOLN: 1000.0000 mg | Freq: Once | INTRAVENOUS | Status: DC | PRN

## 2019-08-31 MED ORDER — PROPOFOL 10 MG/ML IV BOLUS
INTRAVENOUS | Status: DC | PRN
  Administered 2019-08-31: 200 mg via INTRAVENOUS

## 2019-08-31 MED ORDER — LACTATED RINGERS IV SOLN
INTRAVENOUS | Status: DC | PRN
  Administered 2019-08-31: 07:00:00 via INTRAVENOUS

## 2019-08-31 MED ORDER — ROCURONIUM BROMIDE 10 MG/ML (PF) SYRINGE
PREFILLED_SYRINGE | INTRAVENOUS | Status: DC | PRN
  Administered 2019-08-31: 30 mg via INTRAVENOUS

## 2019-08-31 MED ORDER — FENTANYL CITRATE (PF) 250 MCG/5ML IJ SOLN
INTRAMUSCULAR | Status: AC
  Filled 2019-08-31: qty 5

## 2019-08-31 MED ORDER — VITAMIN C 500 MG PO TABS: 1000.0000 mg | ORAL_TABLET | Freq: Every day | ORAL | Status: DC

## 2019-08-31 MED ORDER — PHENTERMINE HCL 37.5 MG PO TABS: 37.5000 mg | ORAL_TABLET | Freq: Every day | ORAL | Status: DC

## 2019-08-31 MED ORDER — PROPOFOL 10 MG/ML IV BOLUS
INTRAVENOUS | Status: AC
  Filled 2019-08-31: qty 20

## 2019-08-31 MED ORDER — ONDANSETRON HCL 4 MG/2ML IJ SOLN
INTRAMUSCULAR | Status: AC
  Filled 2019-08-31: qty 2

## 2019-08-31 MED ORDER — LIDOCAINE HCL (CARDIAC) PF 100 MG/5ML IV SOSY
PREFILLED_SYRINGE | INTRAVENOUS | Status: DC | PRN
  Administered 2019-08-31: 80 mg via INTRATRACHEAL

## 2019-08-31 MED ORDER — DEXAMETHASONE SODIUM PHOSPHATE 10 MG/ML IJ SOLN
INTRAMUSCULAR | Status: AC
  Filled 2019-08-31: qty 1

## 2019-08-31 MED ORDER — SUCCINYLCHOLINE CHLORIDE 20 MG/ML IJ SOLN
INTRAMUSCULAR | Status: DC | PRN
  Administered 2019-08-31: 120 mg via INTRAVENOUS

## 2019-08-31 MED ORDER — SUGAMMADEX SODIUM 200 MG/2ML IV SOLN
INTRAVENOUS | Status: DC | PRN
  Administered 2019-08-31: 275 mg via INTRAVENOUS

## 2019-08-31 MED ORDER — MIDAZOLAM HCL 2 MG/2ML IJ SOLN
INTRAMUSCULAR | Status: AC
  Filled 2019-08-31: qty 2

## 2019-08-31 MED ORDER — FENTANYL CITRATE (PF) 250 MCG/5ML IJ SOLN
INTRAMUSCULAR | Status: DC | PRN
  Administered 2019-08-31: 100 ug via INTRAVENOUS

## 2019-08-31 MED ORDER — ACETAMINOPHEN 160 MG/5ML PO SOLN: 1000.0000 mg | Freq: Once | ORAL | Status: DC | PRN

## 2019-08-31 MED ORDER — METFORMIN HCL ER 500 MG PO TB24: 500.0000 mg | ORAL_TABLET | Freq: Every day | ORAL | Status: DC

## 2019-08-31 MED ORDER — ZINC GLUCONATE 50 MG PO TABS: 50.0000 mg | ORAL_TABLET | Freq: Every day | ORAL | Status: DC

## 2019-08-31 MED ORDER — SUCCINYLCHOLINE CHLORIDE 200 MG/10ML IV SOSY
PREFILLED_SYRINGE | INTRAVENOUS | Status: AC
  Filled 2019-08-31: qty 10

## 2019-08-31 MED ORDER — IBUPROFEN 800 MG PO TABS: 800.0000 mg | ORAL_TABLET | Freq: Three times a day (TID) | ORAL | Status: DC | PRN

## 2019-08-31 SURGICAL SUPPLY — 15 items
CATH ROBINSON RED A/P 16FR (CATHETERS) ×3 IMPLANT
DEVICE MYOSURE LITE (MISCELLANEOUS) ×2 IMPLANT
DEVICE MYOSURE REACH (MISCELLANEOUS) IMPLANT
GLOVE BIO SURGEON STRL SZ7 (GLOVE) ×3 IMPLANT
GLOVE BIOGEL PI IND STRL 7.0 (GLOVE) ×1 IMPLANT
GLOVE BIOGEL PI INDICATOR 7.0 (GLOVE) ×2
GOWN STRL REUS W/ TWL LRG LVL3 (GOWN DISPOSABLE) ×2 IMPLANT
GOWN STRL REUS W/TWL LRG LVL3 (GOWN DISPOSABLE) ×4
KIT PROCEDURE FLUENT (KITS) ×3 IMPLANT
KIT TURNOVER KIT B (KITS) ×3 IMPLANT
PACK VAGINAL MINOR WOMEN LF (CUSTOM PROCEDURE TRAY) ×3 IMPLANT
PAD OB MATERNITY 4.3X12.25 (PERSONAL CARE ITEMS) ×3 IMPLANT
SEAL ROD LENS SCOPE MYOSURE (ABLATOR) ×3 IMPLANT
TOWEL GREEN STERILE FF (TOWEL DISPOSABLE) ×6 IMPLANT
UNDERPAD 30X30 (UNDERPADS AND DIAPERS) ×3 IMPLANT

## 2019-08-31 NOTE — Transfer of Care (Signed)
Immediate Anesthesia Transfer of Care Note  Patient: Courtney Molina  Procedure(s) Performed: DILATATION & CURETTAGE/HYSTEROSCOPY WITH MYOSURE POLYPECTOMY (N/A Vagina )  Patient Location: PACU  Anesthesia Type:General  Level of Consciousness: awake, oriented and patient cooperative  Airway & Oxygen Therapy: Patient Spontanous Breathing and Patient connected to face mask oxygen  Post-op Assessment: Report given to RN and Post -op Vital signs reviewed and stable  Post vital signs: Reviewed  Last Vitals:  Vitals Value Taken Time  BP 141/93 08/31/19 0815  Temp    Pulse 102 08/31/19 0815  Resp 16 08/31/19 0815  SpO2 99 % 08/31/19 0815  Vitals shown include unvalidated device data.  Last Pain:  Vitals:   08/31/19 0649  TempSrc: Oral  PainSc: 1       Patients Stated Pain Goal: 2 ([id number])  Complications: No apparent anesthesia complications

## 2019-08-31 NOTE — Anesthesia Preprocedure Evaluation (Signed)
Anesthesia Evaluation  Patient identified by MRN, date of birth, ID band Patient awake    Reviewed: Allergy & Precautions, NPO status , Patient's Chart, lab work & pertinent test results  History of Anesthesia Complications Negative for: history of anesthetic complications  Airway Mallampati: III  TM Distance: >3 FB Neck ROM: Full    Dental  (+) Dental Advisory Given   Pulmonary neg pulmonary ROS,    breath sounds clear to auscultation       Cardiovascular negative cardio ROS   Rhythm:Regular     Neuro/Psych PSYCHIATRIC DISORDERS Anxiety negative neurological ROS     GI/Hepatic negative GI ROS, Neg liver ROS,   Endo/Other  diabetes, Type 2, Oral Hypoglycemic AgentsMorbid obesity  Renal/GU negative Renal ROS     Musculoskeletal   Abdominal   Peds  Hematology   Anesthesia Other Findings   Reproductive/Obstetrics                             Anesthesia Physical Anesthesia Plan  ASA: IV  Anesthesia Plan: General   Post-op Pain Management:    Induction: Intravenous  PONV Risk Score and Plan: 3 and Ondansetron and Dexamethasone  Airway Management Planned: Oral ETT  Additional Equipment: None  Intra-op Plan:   Post-operative Plan: Extubation in OR  Informed Consent: I have reviewed the patients History and Physical, chart, labs and discussed the procedure including the risks, benefits and alternatives for the proposed anesthesia with the patient or authorized representative who has indicated his/her understanding and acceptance.     Dental advisory given  Plan Discussed with: CRNA and Surgeon  Anesthesia Plan Comments:         Anesthesia Quick Evaluation

## 2019-08-31 NOTE — Anesthesia Procedure Notes (Signed)
Procedure Name: Intubation Date/Time: 08/31/2019 7:38 AM Performed by: Jenne Campus, CRNA Pre-anesthesia Checklist: Patient identified, Emergency Drugs available, Suction available and Patient being monitored Patient Re-evaluated:Patient Re-evaluated prior to induction Oxygen Delivery Method: Circle System Utilized Preoxygenation: Pre-oxygenation with 100% oxygen Induction Type: IV induction and Cricoid Pressure applied Ventilation: Mask ventilation without difficulty Laryngoscope Size: Miller and 2 Grade View: Grade I Tube type: Oral Tube size: 7.5 mm Number of attempts: 1 Airway Equipment and Method: Stylet and Oral airway Placement Confirmation: ETT inserted through vocal cords under direct vision,  positive ETCO2 and breath sounds checked- equal and bilateral Secured at: 21 cm Tube secured with: Tape Dental Injury: Teeth and Oropharynx as per pre-operative assessment

## 2019-08-31 NOTE — Progress Notes (Signed)
The patient was re-examined with no change in status 

## 2019-08-31 NOTE — Op Note (Signed)
Preoperative diagnosis: Abnormal uterine bleeding, endometrial polyp  Postoperative diagnosis: Same  Procedure: D&C with hysteroscopy, MyoSure resection of an endometrial polyp  Surgeon: Matthew Saras  Anesthesia: General  EBL: Less than 5 cc  Procedure and findings  The patient was taken to the operating room after an adequate level of general anesthesia was obtained with the patient's legs in stirrups the perineum and vagina were prepped and draped in the bladder was drained.  Appropriate timeouts were taken at that point.  Graves speculum was positioned, the anterior cervical lip was grasped with a tenaculum, paracervical block was then created by infiltrating at 3 at 9:00 submucosally, 5 to 7 cc of 1% plain Xylocaine at each side after negative aspiration.  Uterus was then sounded to 8 8 to 9 cm, progressively dilated to 27 Pratt dilator, continuous-flow hysteroscope was then inserted and cavity was inspected both tubal ostia were normal there was an anterior fundal polyp noted that was solitary the rest of the endometrium was fairly thin she also had a right tubal ostia small polyp.  The [facility] sure light resector was position the right tubal ostia polyp and the anterior fundal polyp were resected down to the surrounding level of the endometrium.  The rest of the cavity was normal pictures were taken minimal to no bleeding.  She tolerated this well went to recovery room in good condition.  Dictated with Dragon Medical 1  Margarette Asal MD

## 2019-09-01 ENCOUNTER — Encounter (HOSPITAL_COMMUNITY): Payer: Self-pay | Admitting: Obstetrics and Gynecology

## 2019-09-01 LAB — SURGICAL PATHOLOGY

## 2019-09-02 NOTE — Anesthesia Postprocedure Evaluation (Signed)
Anesthesia Post Note  Patient: Courtney Molina  Procedure(s) Performed: DILATATION & CURETTAGE/HYSTEROSCOPY WITH MYOSURE POLYPECTOMY (N/A Vagina )     Patient location during evaluation: PACU Anesthesia Type: General Level of consciousness: awake and alert Pain management: pain level controlled Vital Signs Assessment: post-procedure vital signs reviewed and stable Respiratory status: spontaneous breathing, nonlabored ventilation, respiratory function stable and patient connected to nasal cannula oxygen Cardiovascular status: blood pressure returned to baseline and stable Postop Assessment: no apparent nausea or vomiting Anesthetic complications: no    Last Vitals:  Vitals:   08/31/19 0845 08/31/19 0900  BP: 113/77 119/72  Pulse: 91 91  Resp: 18 17  Temp: 36.6 C   SpO2: 96% 97%    Last Pain:  Vitals:   08/31/19 0845  TempSrc:   PainSc: 3                  Lorenso Quirino

## 2019-09-08 DIAGNOSIS — D509 Iron deficiency anemia, unspecified: Secondary | ICD-10-CM | POA: Diagnosis not present

## 2020-01-04 DIAGNOSIS — Z7189 Other specified counseling: Secondary | ICD-10-CM | POA: Diagnosis not present

## 2020-01-04 DIAGNOSIS — F439 Reaction to severe stress, unspecified: Secondary | ICD-10-CM | POA: Diagnosis not present

## 2020-01-04 DIAGNOSIS — R7309 Other abnormal glucose: Secondary | ICD-10-CM | POA: Diagnosis not present

## 2020-01-04 DIAGNOSIS — E669 Obesity, unspecified: Secondary | ICD-10-CM | POA: Diagnosis not present

## 2020-05-03 DIAGNOSIS — E119 Type 2 diabetes mellitus without complications: Secondary | ICD-10-CM | POA: Diagnosis not present

## 2020-05-04 DIAGNOSIS — Z658 Other specified problems related to psychosocial circumstances: Secondary | ICD-10-CM | POA: Diagnosis not present

## 2020-05-04 DIAGNOSIS — F418 Other specified anxiety disorders: Secondary | ICD-10-CM | POA: Diagnosis not present

## 2020-06-01 DIAGNOSIS — Z658 Other specified problems related to psychosocial circumstances: Secondary | ICD-10-CM | POA: Diagnosis not present

## 2020-06-01 DIAGNOSIS — F418 Other specified anxiety disorders: Secondary | ICD-10-CM | POA: Diagnosis not present

## 2020-06-08 DIAGNOSIS — F4323 Adjustment disorder with mixed anxiety and depressed mood: Secondary | ICD-10-CM | POA: Diagnosis not present

## 2020-06-29 DIAGNOSIS — F4323 Adjustment disorder with mixed anxiety and depressed mood: Secondary | ICD-10-CM | POA: Diagnosis not present

## 2020-07-06 DIAGNOSIS — R7309 Other abnormal glucose: Secondary | ICD-10-CM | POA: Diagnosis not present

## 2020-07-06 DIAGNOSIS — R7989 Other specified abnormal findings of blood chemistry: Secondary | ICD-10-CM | POA: Diagnosis not present

## 2020-07-06 DIAGNOSIS — Z0000 Encounter for general adult medical examination without abnormal findings: Secondary | ICD-10-CM | POA: Diagnosis not present

## 2020-07-12 DIAGNOSIS — Z0000 Encounter for general adult medical examination without abnormal findings: Secondary | ICD-10-CM | POA: Diagnosis not present

## 2020-07-12 DIAGNOSIS — F418 Other specified anxiety disorders: Secondary | ICD-10-CM | POA: Diagnosis not present

## 2020-07-12 DIAGNOSIS — F439 Reaction to severe stress, unspecified: Secondary | ICD-10-CM | POA: Diagnosis not present

## 2020-07-12 DIAGNOSIS — E119 Type 2 diabetes mellitus without complications: Secondary | ICD-10-CM | POA: Diagnosis not present

## 2020-07-12 DIAGNOSIS — Z23 Encounter for immunization: Secondary | ICD-10-CM | POA: Diagnosis not present

## 2020-07-13 DIAGNOSIS — F4323 Adjustment disorder with mixed anxiety and depressed mood: Secondary | ICD-10-CM | POA: Diagnosis not present

## 2020-07-27 DIAGNOSIS — E119 Type 2 diabetes mellitus without complications: Secondary | ICD-10-CM | POA: Diagnosis not present

## 2020-07-27 DIAGNOSIS — E669 Obesity, unspecified: Secondary | ICD-10-CM | POA: Diagnosis not present

## 2020-07-27 DIAGNOSIS — Z6841 Body Mass Index (BMI) 40.0 and over, adult: Secondary | ICD-10-CM | POA: Diagnosis not present

## 2021-01-16 DIAGNOSIS — F4323 Adjustment disorder with mixed anxiety and depressed mood: Secondary | ICD-10-CM | POA: Diagnosis not present

## 2021-01-16 DIAGNOSIS — F411 Generalized anxiety disorder: Secondary | ICD-10-CM | POA: Diagnosis not present

## 2021-02-07 ENCOUNTER — Ambulatory Visit (INDEPENDENT_AMBULATORY_CARE_PROVIDER_SITE_OTHER): Payer: Self-pay | Admitting: Family Medicine

## 2021-02-21 ENCOUNTER — Ambulatory Visit (INDEPENDENT_AMBULATORY_CARE_PROVIDER_SITE_OTHER): Payer: Self-pay | Admitting: Family Medicine

## 2021-02-21 ENCOUNTER — Encounter (INDEPENDENT_AMBULATORY_CARE_PROVIDER_SITE_OTHER): Payer: Self-pay

## 2021-03-07 ENCOUNTER — Ambulatory Visit (INDEPENDENT_AMBULATORY_CARE_PROVIDER_SITE_OTHER): Payer: Self-pay | Admitting: Family Medicine

## 2021-10-31 ENCOUNTER — Encounter (HOSPITAL_BASED_OUTPATIENT_CLINIC_OR_DEPARTMENT_OTHER): Payer: Self-pay

## 2021-10-31 ENCOUNTER — Other Ambulatory Visit: Payer: Self-pay

## 2021-10-31 ENCOUNTER — Emergency Department (HOSPITAL_BASED_OUTPATIENT_CLINIC_OR_DEPARTMENT_OTHER)
Admission: EM | Admit: 2021-10-31 | Discharge: 2021-10-31 | Disposition: A | Discharge status: Home / Self Care | Payer: BC Managed Care – PPO | Attending: Emergency Medicine | Admitting: Emergency Medicine

## 2021-10-31 DIAGNOSIS — Z7984 Long term (current) use of oral hypoglycemic drugs: Secondary | ICD-10-CM | POA: Insufficient documentation

## 2021-10-31 DIAGNOSIS — E119 Type 2 diabetes mellitus without complications: Secondary | ICD-10-CM | POA: Insufficient documentation

## 2021-10-31 DIAGNOSIS — L02212 Cutaneous abscess of back [any part, except buttock]: Secondary | ICD-10-CM | POA: Insufficient documentation

## 2021-10-31 DIAGNOSIS — L0291 Cutaneous abscess, unspecified: Secondary | ICD-10-CM

## 2021-10-31 MED ORDER — ACETAMINOPHEN 500 MG PO TABS
1000.0000 mg | ORAL_TABLET | Freq: Once | ORAL | Status: DC
  Filled 2021-10-31: qty 2

## 2021-10-31 MED ORDER — CEPHALEXIN 500 MG PO CAPS: 500.0000 mg | ORAL_CAPSULE | Freq: Four times a day (QID) | ORAL | 0 refills | Status: DC

## 2021-10-31 MED ORDER — LIDOCAINE HCL (PF) 1 % IJ SOLN
10.0000 mL | Freq: Once | INTRAMUSCULAR | Status: AC
  Administered 2021-10-31: 10 mL
  Filled 2021-10-31: qty 10

## 2021-10-31 MED ORDER — IBUPROFEN 800 MG PO TABS
800.0000 mg | ORAL_TABLET | Freq: Once | ORAL | Status: AC
  Administered 2021-10-31: 800 mg via ORAL
  Filled 2021-10-31: qty 1

## 2021-10-31 MED ORDER — CEPHALEXIN 500 MG PO CAPS: 500.0000 mg | ORAL_CAPSULE | Freq: Four times a day (QID) | ORAL | 0 refills | Status: AC

## 2021-10-31 NOTE — ED Triage Notes (Signed)
Pt presents with a rash to her back x4 days. PCP prescribed an ointment, rash is now blistering and draining

## 2021-10-31 NOTE — Discharge Instructions (Addendum)
Take Keflex 4x daily for 5 days. Remove packing in 3 days. If it falls out on its own before that is okay. Return if fever, vomiting, worsening symptoms.

## 2021-10-31 NOTE — ED Notes (Signed)
PA at Bedside.

## 2021-10-31 NOTE — ED Notes (Signed)
RN provided AVS using Teachback Method. Patient verbalizes understanding of Discharge Instructions. Opportunity for Questioning and Answers were provided by RN. Patient Discharged from ED ambulatory to home via Self. 

## 2021-10-31 NOTE — ED Provider Notes (Signed)
[facility] EM[facility] DEPT Provider Note   CSN: [id number] Arrival date & time: 10/31/21  2053     History Chief Complaint  Patient presents with   Rash    Courtney Molina is a 40 y.o. female.   Rash Associated symptoms: no fever    Patient presents with abscess to the back.  The started on Friday about 4 days ago.  Unable to see the back, no associated pruritus.  She did try an alleviating ointment from her primary care doctor, this did not improve her symptoms.  There is associated drainage that started acutely today causing her to go to the ED.  No fevers, she is diabetic with relatively well-controlled sugar.  Past Medical History:  Diagnosis Date   Anxiety    Diabetes mellitus    Fibroids    Obesity    PCOS (polycystic ovarian syndrome)     Patient Active Problem List   Diagnosis Date Noted   Abnormal uterine bleeding (AUB) 04/20/2014   Dysmenorrhea 04/20/2014   PCOS (polycystic ovarian syndrome)    Leiomyoma of uterus, unspecified 09/10/2012    Past Surgical History:  Procedure Laterality Date   DILATATION & CURETTAGE/HYSTEROSCOPY WITH MYOSURE N/A 08/31/2019   Procedure: DILATATION & CURETTAGE/HYSTEROSCOPY WITH MYOSURE POLYPECTOMY;  Surgeon: Molli Posey, MD;  Location: [facility];  Service: Gynecology;  Laterality: N/A;  Myosure rep will be here confirmed on 06/18/2019 CS   ROBOT ASSISTED MYOMECTOMY  09/12/2012   Procedure: ROBOTIC ASSISTED MYOMECTOMY;  Surgeon: Lahoma Crocker, MD;  Location: [facility];  Service: Gynecology;  Laterality: N/A;     OB History     Gravida  0   Para      Term      Preterm      AB      Living         SAB      IAB      Ectopic      Multiple      Live Births              Family History  Problem Relation Age of Onset   Hypertension Mother    Hypertension Father    Diabetes Father     Social History   Tobacco Use   Smoking status: Never   Smokeless tobacco: Never  Substance Use  Topics   Alcohol use: Yes    Alcohol/week: 1.0 standard drink    Types: 1 Glasses of wine per week    Comment: socially    Drug use: No    Home Medications Prior to Admission medications   Medication Sig Start Date End Date Taking? Authorizing Provider  Ascorbic Acid (VITAMIN C) 1000 MG tablet Take 1,000 mg by mouth daily.    [provider]  clomiPHENE (CLOMID) 50 MG tablet Take 1 tablet po daily for 5 days. Start on day 5 of your cycle Patient not taking: Reported on 06/24/2019 12/26/15   Constant, Peggy, MD  ibuprofen (ADVIL) 200 MG tablet Take 800 mg by mouth every 6 (six) hours as needed.    [provider]  ibuprofen (ADVIL,MOTRIN) 600 MG tablet Take 1 tablet (600 mg total) by mouth every 6 (six) hours as needed. Patient not taking: Reported on 06/24/2019 10/02/14   Antonietta Breach, PA-C  medroxyPROGESTERone (PROVERA) 10 MG tablet Take one tablet daily by mouth starting on day 16 or day 21 of your cycle Patient not taking: Reported on 06/24/2019 06/27/15   Constant, Vickii Chafe, MD  megestrol (MEGACE) 40 MG tablet Take 2 tablets (80 mg total) by mouth daily. Patient not taking: Reported on 06/24/2019 11/30/15   Constant, Peggy, MD  metFORMIN (GLUCOPHAGE) 1000 MG tablet Take 1 tablet (1,000 mg total) by mouth 2 (two) times daily with a meal. Patient not taking: Reported on 06/24/2019 06/27/15   Constant, Peggy, MD  metFORMIN (GLUCOPHAGE-XR) 500 MG 24 hr tablet Take 500 mg by mouth daily with breakfast.    [provider]  naproxen (NAPROSYN) 500 MG tablet Take 1 tablet (500 mg total) by mouth 2 (two) times daily with a meal. Patient not taking: Reported on 06/24/2019 11/30/15   Constant, Peggy, MD  Norgestimate-Ethinyl Estradiol Triphasic (TRI-LO-MARZIA) 0.18/0.215/0.25 MG-25 MCG tab Take 1 tablet by mouth daily.    [provider]  phentermine (ADIPEX-P) 37.5 MG tablet Take 37.5 mg by mouth daily before breakfast.    [provider]  zinc gluconate 50 MG  tablet Take 50 mg by mouth daily.    [provider]    Allergies    Pineapple  Review of Systems   Review of Systems  Constitutional:  Negative for fever.  Skin:  Positive for rash.       Abscess   Physical Exam Updated Vital Signs BP (!) 140/94 (BP Location: Right Arm)   Temp 98 F (36.7 C) (Oral)   Resp 18   SpO2 98%   Physical Exam Vitals and nursing note reviewed. Exam conducted with a chaperone present.  Constitutional:      General: She is not in acute distress.    Appearance: Normal appearance.  HENT:     Head: Normocephalic and atraumatic.  Eyes:     General: No scleral icterus.    Extraocular Movements: Extraocular movements intact.     Pupils: Pupils are equal, round, and reactive to light.  Skin:    Coloration: Skin is not jaundiced.     Comments: Area of indurated skin and actively draining abscess noted to the mid lower back from the T10 on the thoracic spine.  No surrounding erythema concerning for cellulitis.  Neurological:     Mental Status: She is alert. Mental status is at baseline.     Coordination: Coordination normal.   ED Results / Procedures / Treatments   Labs (all labs ordered are listed, but only abnormal results are displayed) Labs Reviewed - No data to display  EKG None  Radiology No results found.  Procedures .Marland KitchenIncision and Drainage  Date/Time: 10/31/2021 9:49 PM Performed by: Sherrill Raring, PA-C Authorized by: Sherrill Raring, PA-C   Consent:    Consent obtained:  Verbal   Consent given by:  Patient   Risks discussed:  Bleeding, incomplete drainage, pain and damage to other organs   Alternatives discussed:  No treatment Universal protocol:    Procedure explained and questions answered to patient or proxy's satisfaction: yes     Relevant documents present and verified: yes     Test results available : yes     Imaging studies available: yes     Required blood products, implants, devices, and special equipment available:  yes     Site/side marked: yes     Immediately prior to procedure, a time out was called: yes     Patient identity confirmed:  Verbally with patient Location:    Type:  Abscess   Location:  Trunk   Trunk location:  Back Pre-procedure details:    Skin preparation:  Povidone-iodine Sedation:    Sedation type:  None Anesthesia:    Anesthesia method:  Local infiltration   Local anesthetic:  Lidocaine 1% w/o epi Procedure type:    Complexity:  Complex Procedure details:    Ultrasound guidance: no     Needle aspiration: no     Incision types:  Single straight   Incision depth:  Subcutaneous   Wound management:  Probed and deloculated, irrigated with saline and extensive cleaning   Drainage:  Purulent   Drainage amount:  Moderate   Wound treatment:  Wound left open   Packing materials:  1/4 in gauze Post-procedure details:    Procedure completion:  Tolerated well, no immediate complications   Medications Ordered in ED Medications  lidocaine (PF) (XYLOCAINE) 1 % injection 10 mL (has no administration in time range)    ED Course  I have reviewed the triage vital signs and the nursing notes.  Pertinent labs & imaging results that were available during my care of the patient were reviewed by me and considered in my medical decision making (see chart for details).    MDM Rules/Calculators/A&P                           Stable vitals, patient is not septic.  She has an abscess to the back that is actively draining.  I&D performed to more thoroughly drain and washout the abscess, patient tolerated the procedure well and there was moderate amount of purulent drainage.  We will start the patient on Keflex given she is diabetic, patient discharged in stable condition.  Final Clinical Impression(s) / ED Diagnoses Final diagnoses:  None    Rx / DC Orders ED Discharge Orders     None        Sherrill Raring, PA-C 10/31/21 2151    Lennice Sites, DO 10/31/21 2251

## 2021-11-24 DIAGNOSIS — Z0000 Encounter for general adult medical examination without abnormal findings: Secondary | ICD-10-CM | POA: Diagnosis not present

## 2021-11-24 DIAGNOSIS — E119 Type 2 diabetes mellitus without complications: Secondary | ICD-10-CM | POA: Diagnosis not present

## 2021-11-30 ENCOUNTER — Other Ambulatory Visit (HOSPITAL_COMMUNITY): Payer: Self-pay

## 2021-11-30 MED ORDER — MOUNJARO 5 MG/0.5ML ~~LOC~~ SOAJ
5.0000 mg | SUBCUTANEOUS | 2 refills | Status: AC
  Filled 2021-11-30: qty 2, 28d supply, fill #0
  Filled 2021-12-29: qty 2, 28d supply, fill #1

## 2021-12-29 ENCOUNTER — Other Ambulatory Visit (HOSPITAL_COMMUNITY): Payer: Self-pay

## 2022-02-23 DIAGNOSIS — E119 Type 2 diabetes mellitus without complications: Secondary | ICD-10-CM | POA: Diagnosis not present

## 2022-03-02 DIAGNOSIS — T887XXA Unspecified adverse effect of drug or medicament, initial encounter: Secondary | ICD-10-CM | POA: Diagnosis not present

## 2022-03-02 DIAGNOSIS — E119 Type 2 diabetes mellitus without complications: Secondary | ICD-10-CM | POA: Diagnosis not present

## 2022-04-25 DIAGNOSIS — Z1231 Encounter for screening mammogram for malignant neoplasm of breast: Secondary | ICD-10-CM | POA: Diagnosis not present

## 2022-04-25 DIAGNOSIS — Z113 Encounter for screening for infections with a predominantly sexual mode of transmission: Secondary | ICD-10-CM | POA: Diagnosis not present

## 2022-04-25 DIAGNOSIS — Z6841 Body Mass Index (BMI) 40.0 and over, adult: Secondary | ICD-10-CM | POA: Diagnosis not present

## 2022-04-25 DIAGNOSIS — Z01419 Encounter for gynecological examination (general) (routine) without abnormal findings: Secondary | ICD-10-CM | POA: Diagnosis not present

## 2022-04-25 DIAGNOSIS — Z124 Encounter for screening for malignant neoplasm of cervix: Secondary | ICD-10-CM | POA: Diagnosis not present

## 2022-04-25 DIAGNOSIS — Z1151 Encounter for screening for human papillomavirus (HPV): Secondary | ICD-10-CM | POA: Diagnosis not present

## 2022-05-24 ENCOUNTER — Other Ambulatory Visit (HOSPITAL_COMMUNITY): Payer: Self-pay

## 2022-05-24 MED ORDER — MOUNJARO 15 MG/0.5ML ~~LOC~~ SOAJ
15.0000 mg | SUBCUTANEOUS | 3 refills | Status: AC
  Filled 2022-05-24: qty 2, 28d supply, fill #0
  Filled 2022-06-15: qty 2, 28d supply, fill #1

## 2022-05-28 ENCOUNTER — Other Ambulatory Visit (HOSPITAL_COMMUNITY): Payer: Self-pay

## 2022-06-15 ENCOUNTER — Other Ambulatory Visit (HOSPITAL_COMMUNITY): Payer: Self-pay

## 2022-06-18 ENCOUNTER — Other Ambulatory Visit (HOSPITAL_COMMUNITY): Payer: Self-pay

## 2022-06-18 MED ORDER — MOUNJARO 12.5 MG/0.5ML ~~LOC~~ SOAJ
12.5000 mg | SUBCUTANEOUS | 5 refills | Status: AC
  Filled 2022-06-18: qty 2, 28d supply, fill #0

## 2022-06-22 ENCOUNTER — Other Ambulatory Visit (HOSPITAL_COMMUNITY): Payer: Self-pay

## 2022-06-29 DIAGNOSIS — E119 Type 2 diabetes mellitus without complications: Secondary | ICD-10-CM | POA: Diagnosis not present

## 2022-07-04 DIAGNOSIS — E119 Type 2 diabetes mellitus without complications: Secondary | ICD-10-CM | POA: Diagnosis not present

## 2022-07-18 ENCOUNTER — Encounter (INDEPENDENT_AMBULATORY_CARE_PROVIDER_SITE_OTHER): Payer: Self-pay
